# Patient Record
Sex: Male | Born: 1995 | Race: White | Hispanic: No | Marital: Single | Smoking: Current some day smoker
Health system: Southern US, Community
[De-identification: ages and names within clinical notes are randomized; demographics above are authoritative.]

## PROBLEM LIST (undated history)

## (undated) DIAGNOSIS — I10 Essential (primary) hypertension: Secondary | ICD-10-CM

## (undated) DIAGNOSIS — F909 Attention-deficit hyperactivity disorder, unspecified type: Secondary | ICD-10-CM

## (undated) DIAGNOSIS — F333 Major depressive disorder, recurrent, severe with psychotic symptoms: Secondary | ICD-10-CM

## (undated) DIAGNOSIS — F419 Anxiety disorder, unspecified: Secondary | ICD-10-CM

## (undated) HISTORY — DX: Anxiety disorder, unspecified: F41.9

## (undated) HISTORY — PX: OTHER SURGICAL HISTORY: SHX169

## (undated) HISTORY — DX: Major depressive disorder, recurrent, severe with psychotic symptoms: F33.3

## (undated) HISTORY — DX: Attention-deficit hyperactivity disorder, unspecified type: F90.9

---
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
## 2017-04-09 ENCOUNTER — Ambulatory Visit (INDEPENDENT_AMBULATORY_CARE_PROVIDER_SITE_OTHER): Payer: BC Managed Care – PPO | Admitting: Emergency Medicine

## 2017-04-09 ENCOUNTER — Encounter: Payer: Self-pay | Admitting: Emergency Medicine

## 2017-04-09 VITALS — BP 99/64 | HR 82 | Temp 99.0°F | Resp 17 | Ht 69.0 in | Wt 161.0 lb

## 2017-04-09 DIAGNOSIS — Z23 Encounter for immunization: Secondary | ICD-10-CM

## 2017-04-09 NOTE — Patient Instructions (Signed)
     IF you received an x-ray today, you will receive an invoice from [facility]. Please contact [facility] at [phone] with questions or concerns regarding your invoice.   IF you received labwork today, you will receive an invoice from [facility]. Please contact [facility] at [phone] with questions or concerns regarding your invoice.   Our billing staff will not be able to assist you with questions regarding bills from these companies.  You will be contacted with the lab results as soon as they are available. The fastest way to get your results is to activate your My Chart account. Instructions are located on the last page of this paperwork. If you have not heard from us regarding the results in 2 weeks, please contact this office.     

## 2017-04-10 NOTE — Progress Notes (Signed)
Here for immunizations required by college. Brought no prior records. No state records found. Advised to come back with needed vaccination records.

## 2017-04-11 ENCOUNTER — Ambulatory Visit (INDEPENDENT_AMBULATORY_CARE_PROVIDER_SITE_OTHER): Payer: BC Managed Care – PPO | Admitting: Emergency Medicine

## 2017-04-11 ENCOUNTER — Telehealth: Payer: Self-pay | Admitting: *Deleted

## 2017-04-11 ENCOUNTER — Encounter: Payer: Self-pay | Admitting: Emergency Medicine

## 2017-04-11 VITALS — BP 127/62 | HR 69 | Temp 98.2°F | Resp 16 | Ht 69.0 in | Wt 158.2 lb

## 2017-04-11 DIAGNOSIS — Z2839 Other underimmunization status: Secondary | ICD-10-CM

## 2017-04-11 DIAGNOSIS — Z9189 Other specified personal risk factors, not elsewhere classified: Secondary | ICD-10-CM

## 2017-04-11 NOTE — Telephone Encounter (Signed)
Patient forms are in Dr Latrelle DodrillSagardia's box at 102 to be completed when lab results are in.

## 2017-04-11 NOTE — Patient Instructions (Signed)
     IF you received an x-ray today, you will receive an invoice from [facility]. Please contact [facility] at [phone] with questions or concerns regarding your invoice.   IF you received labwork today, you will receive an invoice from [facility]. Please contact [facility] at [phone] with questions or concerns regarding your invoice.   Our billing staff will not be able to assist you with questions regarding bills from these companies.  You will be contacted with the lab results as soon as they are available. The fastest way to get your results is to activate your My Chart account. Instructions are located on the last page of this paperwork. If you have not heard from us regarding the results in 2 weeks, please contact this office.     

## 2017-04-11 NOTE — Progress Notes (Signed)
Timothy Patterson D Nickless 21 y.o.   Chief Complaint  Patient presents with  . Immunizations    per patient   . Depression    total score=8    HISTORY OF PRESENT ILLNESS: This is a 21 y.o. male here for vaccination advice; needed for college; has no records available. Form reviewed with patient and requirements discussed with patient.  HPI   Prior to Admission medications   Not on File    No Known Allergies  There are no active problems to display for this patient.   Past Medical History:  Diagnosis Date  . ADHD (attention deficit hyperactivity disorder)    HX of ADHD  . Anxiety     Past Surgical History:  Procedure Laterality Date  . ingrown toe nail      Social History   Social History  . Marital status: Single    Spouse name: N/A  . Number of children: N/A  . Years of education: N/A   Occupational History  . Not on file.   Social History Main Topics  . Smoking status: Never Smoker  . Smokeless tobacco: Never Used  . Alcohol use No  . Drug use: No  . Sexual activity: No   Other Topics Concern  . Not on file   Social History Narrative  . No narrative on file    Family History  Problem Relation Age of Onset  . Hyperlipidemia Paternal Grandfather   . Depression Mother   . Anxiety disorder Mother   . Bipolar disorder Mother   . Depression Sister   . Anxiety disorder Sister   . Bipolar disorder Maternal Grandmother   . Depression Maternal Grandmother      ROS Vitals:   04/11/17 1002  BP: 127/62  Pulse: 69  Resp: 16  Temp: 98.2 F (36.8 C)  SpO2: 98%     Physical Exam  Constitutional: He is oriented to person, place, and time. He appears well-developed and well-nourished.  HENT:  Head: Normocephalic and atraumatic.  Eyes: Pupils are equal, round, and reactive to light.  Neck: Normal range of motion.  Cardiovascular: Normal rate.   Pulmonary/Chest: Effort normal.  Musculoskeletal: Normal range of motion.  Neurological: He is  alert and oriented to person, place, and time.  Skin: Skin is warm and dry.  Vitals reviewed.    ASSESSMENT & PLAN: Timothy Patterson was seen today for immunizations and depression.  Diagnoses and all orders for this visit:  Incomplete immunization status -     Measles/Mumps/Rubella Immunity -     Varicella zoster antibody, IgG -     Hepatitis B surface antibody    Return next week to determine immune status. Vaccinations then according to results.  Edwina BarthMiguel Keil Pickering, MD [facility]

## 2017-04-12 LAB — MEASLES/MUMPS/RUBELLA IMMUNITY
MUMPS ABS, IGG: 271 [AU]/ml (ref >10.9)
RUBELLA: 3.62 {index} (ref >0.99)
RUBEOLA AB, IGG: 225 [AU]/ml (ref >29.9)

## 2017-04-12 LAB — VARICELLA ZOSTER ANTIBODY, IGG: Varicella zoster IgG: 747 index (ref >165)

## 2017-04-12 LAB — HEPATITIS B SURFACE ANTIBODY, QUANTITATIVE: Hepatitis B Surf Ab Quant: 3.4 m[IU]/mL — ABNORMAL LOW (ref >9.9)

## 2017-04-16 ENCOUNTER — Ambulatory Visit (INDEPENDENT_AMBULATORY_CARE_PROVIDER_SITE_OTHER): Payer: BC Managed Care – PPO | Admitting: Emergency Medicine

## 2017-04-16 ENCOUNTER — Encounter: Payer: Self-pay | Admitting: Emergency Medicine

## 2017-04-16 VITALS — BP 129/75 | HR 72 | Temp 98.7°F | Resp 18 | Ht 69.0 in | Wt 158.2 lb

## 2017-04-16 DIAGNOSIS — Z0184 Encounter for antibody response examination: Secondary | ICD-10-CM | POA: Diagnosis not present

## 2017-04-16 DIAGNOSIS — Z23 Encounter for immunization: Secondary | ICD-10-CM

## 2017-04-16 NOTE — Progress Notes (Signed)
Timothy Patterson [age] y.o.   Chief Complaint  Patient presents with  . Immunizations    getting results from labs     HISTORY OF PRESENT ILLNESS: This is a [age] y.o. male here to check blood results and get needed vaccines as required by college.  HPI   Prior to Admission medications   Not on File    No Known Allergies  Patient Active Problem List   Diagnosis Date Noted  . Immunity status testing 04/16/2017  . Need for meningitis vaccination 04/16/2017    Past Medical History:  Diagnosis Date  . ADHD (attention deficit hyperactivity disorder)    HX of ADHD  . Anxiety     Past Surgical History:  Procedure Laterality Date  . ingrown toe nail      Social History   Social History  . Marital status: Single    Spouse name: N/A  . Number of children: N/A  . Years of education: N/A   Occupational History  . Not on file.   Social History Main Topics  . Smoking status: Current Some Day Smoker    Types: Cigarettes  . Smokeless tobacco: Never Used  . Alcohol use No  . Drug use: No  . Sexual activity: No   Other Topics Concern  . Not on file   Social History Narrative  . No narrative on file    Family History  Problem Relation Age of Onset  . Hyperlipidemia Paternal Grandfather   . Depression Mother   . Anxiety disorder Mother   . Bipolar disorder Mother   . Depression Sister   . Anxiety disorder Sister   . Bipolar disorder Maternal Grandmother   . Depression Maternal Grandmother      ROS   Vitals:   04/16/17 1203  BP: 129/75  Pulse: 72  Resp: 18  Temp: 98.7 F (37.1 C)  SpO2: 98%     Physical Exam  Constitutional: He is oriented to person, place, and time. He appears well-developed and well-nourished.  HENT:  Head: Normocephalic.  Eyes: Pupils are equal, round, and reactive to light.  Cardiovascular: Normal rate.   Pulmonary/Chest: Effort normal.  Neurological: He is alert and oriented to person, place, and time.  Skin: Skin is  warm and dry.  Psychiatric: He has a normal mood and affect. His behavior is normal.  Vitals reviewed.    ASSESSMENT & PLAN: Timothy Patterson was seen today for immunizations.  Diagnoses and all orders for this visit:  Immunity status testing -     Poliovirus antibodies, types 1, 2, and 3  Need for prophylactic vaccination and inoculation against viral hepatitis -     Hepatitis B vaccine adult IM  Need for meningitis vaccination -     Meningococcal conjugate vaccine 4-valent IM  Other orders -     Cancel: Tdap vaccine greater than or equal to 7yo IM      Edwina BarthMiguel Gill Delrossi, MD [facility]

## 2017-04-16 NOTE — Patient Instructions (Signed)
     IF you received an x-ray today, you will receive an invoice from [facility]. Please contact [facility] at [phone] with questions or concerns regarding your invoice.   IF you received labwork today, you will receive an invoice from [facility]. Please contact [facility] at [phone] with questions or concerns regarding your invoice.   Our billing staff will not be able to assist you with questions regarding bills from these companies.  You will be contacted with the lab results as soon as they are available. The fastest way to get your results is to activate your My Chart account. Instructions are located on the last page of this paperwork. If you have not heard from us regarding the results in 2 weeks, please contact this office.     

## 2017-04-21 ENCOUNTER — Ambulatory Visit: Payer: BC Managed Care – PPO | Admitting: Emergency Medicine

## 2017-04-21 ENCOUNTER — Ambulatory Visit (INDEPENDENT_AMBULATORY_CARE_PROVIDER_SITE_OTHER): Payer: BC Managed Care – PPO | Admitting: Psychology

## 2017-04-21 DIAGNOSIS — F1010 Alcohol abuse, uncomplicated: Secondary | ICD-10-CM | POA: Diagnosis not present

## 2017-04-21 DIAGNOSIS — F1210 Cannabis abuse, uncomplicated: Secondary | ICD-10-CM

## 2017-04-21 LAB — POLIOVIRUS ANTIBODIES, TYPES 1, 2, AND 3
Poliovirus Ab Type 1: >=1:8 {titer}
Poliovirus Ab Type 3: >=1:8 {titer}

## 2017-04-22 ENCOUNTER — Encounter (HOSPITAL_COMMUNITY): Payer: Self-pay | Admitting: Psychiatry

## 2017-04-22 ENCOUNTER — Ambulatory Visit (INDEPENDENT_AMBULATORY_CARE_PROVIDER_SITE_OTHER): Payer: BC Managed Care – PPO | Admitting: Psychiatry

## 2017-04-22 ENCOUNTER — Encounter (HOSPITAL_COMMUNITY): Payer: Self-pay | Admitting: Psychology

## 2017-04-22 VITALS — BP 112/64 | HR 81 | Ht 69.0 in | Wt 162.0 lb

## 2017-04-22 DIAGNOSIS — F17210 Nicotine dependence, cigarettes, uncomplicated: Secondary | ICD-10-CM | POA: Diagnosis not present

## 2017-04-22 DIAGNOSIS — F22 Delusional disorders: Secondary | ICD-10-CM | POA: Diagnosis not present

## 2017-04-22 DIAGNOSIS — R440 Auditory hallucinations: Secondary | ICD-10-CM | POA: Diagnosis not present

## 2017-04-22 DIAGNOSIS — F29 Unspecified psychosis not due to a substance or known physiological condition: Secondary | ICD-10-CM

## 2017-04-22 DIAGNOSIS — F1220 Cannabis dependence, uncomplicated: Secondary | ICD-10-CM | POA: Diagnosis not present

## 2017-04-22 DIAGNOSIS — Z818 Family history of other mental and behavioral disorders: Secondary | ICD-10-CM | POA: Diagnosis not present

## 2017-04-22 DIAGNOSIS — F1010 Alcohol abuse, uncomplicated: Secondary | ICD-10-CM | POA: Insufficient documentation

## 2017-04-22 MED ORDER — QUETIAPINE FUMARATE 50 MG PO TABS: 50.0000 mg | ORAL_TABLET | Freq: Three times a day (TID) | ORAL | 1 refills | Status: DC

## 2017-04-22 NOTE — Progress Notes (Signed)
Psychiatric Initial Adult Assessment   Patient Identification: Timothy Patterson MRN:  [id number] Date of Evaluation:  04/22/2017 Referral Source: CD-IOP Chief Complaint:  hallucinations, anxiety Visit Diagnosis:    ICD-10-CM   1. Psychosis, unspecified psychosis type F29 QUEtiapine (SEROQUEL) 50 MG tablet  2. Cannabis use disorder, moderate, dependence (HCC) F12.20     History of Present Illness:  Timothy Patterson presents today for a psychiatric intake assessment, given concerns about his psychotic symptoms when he was assessed today by the CD IOP program. He presented with symptoms of paranoia, auditory hallucinations. Patient was cooperative and pleasant with Clinical research associate.  He presents as well-dressed, well-groomed, no disorganized speech or erratic behavior. He presents as quite nervous and is shaking his leg for much of our interaction, and appears restless and shifting in his chair quite frequently. He reports that he has struggled with depression and anxiety over the years, but things of gotten worse over the past 5 months since he experimented with mushrooms.  He reports that he started having auditory hallucinations at night, of a voice different than his own, calling him negative names, and hurting his feelings, especially related to issues of his self-esteem. He reports that the voices not one he recognizes, and it feels like it's coming from within his head but is different than his thoughts. He reports that he is able to make sense of the voice as being a trick from his brain, but it is still quite scary in the moment. He denies any visual hallucinations, but reports that sometimes he feels like he sees shadows. He reports that he does feel paranoid especially out public places, feels like others are judging him. He does not have any beliefs that other people can read his mind, but he does feel like he can sense the mood and body language of others and in a way can read other peoples  minds. He has also been worried that the FBI could monitor some of the websites that he goes to, but he was quite vague about what websites he might be using.  He reports that he realizes this all sounds "crazy" and he reports that he realizes that none of this is actually happening, and he maintains insight that his anxiety definitely makes these feelings worse. He reports that he has these out of body experiences when he is anxious and he's more panicked.  He denies any suicidal thoughts or homicidal thoughts. He does not have any specific paranoia towards any specific individuals. He denies any command auditory hallucinations.  I spent time with the patient asking him about his current substance abuse. He admits that he smokes marijuana on a "a lot" very frequent basis, and uses marijuana pretty much every night, and will use during the day when he has adequate access to a supply of marijuana. He reports that the marijuana helps him feel more relaxed so he can go to sleep and it also helps his appetite stay stable.  Spent time with the patient discussing the use of medications, in combination with reduction of his marijuana use.  Specifically, we discussed the use of Seroquel for sleep and for anxiety throughout the day. We discussed starting at a dose of 50-100 mg nightly, and 50 mg in the daytime as needed. Also discussed that if he wants to he can spread out the Seroquel to use 50 mg 3 times a day. I reviewed the risks and benefits of atypical antipsychotic, and the potential for weight gain with long-term  use. I suggested to the patient that he significantly cut down his marijuana use, engage in individual therapy, and follow-up with writer in 6 weeks.  Associated Signs/Symptoms: Depression Symptoms:  insomnia, fatigue, feelings of worthlessness/guilt, difficulty concentrating, anxiety, (Hypo) Manic Symptoms:  none Anxiety Symptoms:  Excessive Worry, Panic Symptoms, Social Anxiety, Psychotic  Symptoms:  Delusions, Hallucinations: Auditory Paranoia, PTSD Symptoms: Negative  Past Psychiatric History: No prior psychiatric hospitalizations  Previous Psychotropic Medications: No   Substance Abuse History in the last 12 months:  Yes.    Consequences of Substance Abuse: see symptoms above  Past Medical History:  Past Medical History:  Diagnosis Date  . ADHD (attention deficit hyperactivity disorder)    HX of ADHD  . Anxiety     Past Surgical History:  Procedure Laterality Date  . ingrown toe nail      Family Psychiatric History: as below  Family History:  Family History  Problem Relation Age of Onset  . Depression Mother   . Anxiety disorder Mother   . Bipolar disorder Mother   . Depression Sister   . Anxiety disorder Sister   . Hyperlipidemia Paternal Grandfather   . Bipolar disorder Maternal Grandmother   . Depression Maternal Grandmother     Social History:   Social History   Social History  . Marital status: Single    Spouse name: N/A  . Number of children: N/A  . Years of education: N/A   Social History Main Topics  . Smoking status: Current Some Day Smoker    Types: Cigarettes  . Smokeless tobacco: Never Used     Comment: not often  . Alcohol use 0.0 oz/week     Comment: occasional  . Drug use: No  . Sexual activity: No   Other Topics Concern  . None   Social History Narrative  . None    Additional Social History: in college at [employer]  Allergies:  No Known Allergies  Metabolic Disorder Labs: No results found for: HGBA1C, MPG No results found for: PROLACTIN No results found for: CHOL, TRIG, HDL, CHOLHDL, VLDL, LDLCALC   Current Medications: Current Outpatient Prescriptions  Medication Sig Dispense Refill  . QUEtiapine (SEROQUEL) 50 MG tablet Take 1 tablet (50 mg total) by mouth 3 (three) times daily. 90 tablet 1   No current facility-administered medications for this visit.     Neurologic: Headache:  Negative Seizure: Negative Paresthesias:Negative  Musculoskeletal: Strength & Muscle Tone: within normal limits Gait & Station: normal Patient leans: N/A  Psychiatric Specialty Exam: ROS  Blood pressure 112/64, pulse 81, height 5\' 9"  (1.753 m), weight 162 lb (73.5 kg).Body mass index is 23.92 kg/m.  General Appearance: Casual and Fairly Groomed  Eye Contact:  Fair  Speech:  Clear and Coherent  Volume:  Normal  Mood:  Anxious  Affect:  Appropriate and Congruent  Thought Process:  Coherent and Goal Directed  Orientation:  Full (Time, Place, and Person)  Thought Content:  Delusions and Hallucinations: Auditory  Suicidal Thoughts:  No  Homicidal Thoughts:  No  Memory:  Immediate;   Fair  Judgement:  Fair  Insight:  Shallow  Psychomotor Activity:  Normal  Concentration:  Attention Span: Fair  Recall:  [employer] of Knowledge:Fair  Language: Good  Akathisia:  Negative  Handed:  Right  AIMS (if indicated):  0  Assets:  Communication Skills Desire for Improvement Physical Health Transportation Vocational/Educational  ADL's:  Intact  Cognition: WNL  Sleep:  6-7 hours with THC  Treatment Plan Summary: Timothy Patterson is a 21 year old male with cannabis use disorder, and hallucinogenic abuse, episodic, in addition to unspecified social anxiety and panic. He presents today with symptoms of psychosis likely due to ongoing cannabis abuse. I spent time educating the patient about the dangers of cannabis especially given his presenting symptoms of psychosis. He would benefit from low-dose antipsychotic to more immediately address his anxiety and paranoid symptoms, and we will follow-up in 6 weeks. I cannot rule out early symptoms of thought disorder - his symptoms have been insidiously worsening over the past 4-6 months, and his sobriety from cannabis will be critical and being able to discern whether this is a primary mood/thought disorder or substance related. I recommended the  patient continue to engage in chemical dependence care and follow-up in 6 weeks.  1. Psychosis, unspecified psychosis type   2. Cannabis use disorder, moderate, dependence (HCC)    Follow-up with therapist in office for chemical dependence Initiate Seroquel 50 mg every 8 hours for anxiety, paranoia, panic, depression, hallucinations Recommend patient to discontinue his cannabis use Follow-up in 6 weeks  Burnard Leigh, MD 8/21/20183:01 PM

## 2017-04-22 NOTE — Patient Instructions (Signed)
Take seroquel 50 or 100 mg at bedtime  If you want to take an additional 50 or 100 mg in the daytime, for anxiety, that is okay  Come back in about 6 weeks
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
## 2017-04-23 ENCOUNTER — Encounter: Payer: Self-pay | Admitting: *Deleted

## 2017-04-23 NOTE — Progress Notes (Signed)
Letter sent.

## 2017-05-06 ENCOUNTER — Telehealth (HOSPITAL_COMMUNITY): Payer: Self-pay | Admitting: Licensed Clinical Social Worker

## 2017-05-06 NOTE — Telephone Encounter (Signed)
Called to inquire about pt starting CDIOP. No answer and not able to leave message due to "no voicemail set up".

## 2017-05-07 ENCOUNTER — Telehealth: Payer: Self-pay | Admitting: Physician Assistant

## 2017-05-07 NOTE — Telephone Encounter (Signed)
Needs copies of immunizations and lab results.

## 2017-06-04 ENCOUNTER — Emergency Department (HOSPITAL_COMMUNITY)
Admission: EM | Admit: 2017-06-04 | Discharge: 2017-06-05 | Disposition: A | Discharge status: Other Acute Inpatient Hospital | Payer: BC Managed Care – PPO | Attending: Emergency Medicine | Admitting: Emergency Medicine

## 2017-06-04 ENCOUNTER — Encounter (HOSPITAL_COMMUNITY): Payer: Self-pay | Admitting: Emergency Medicine

## 2017-06-04 DIAGNOSIS — F17200 Nicotine dependence, unspecified, uncomplicated: Secondary | ICD-10-CM | POA: Insufficient documentation

## 2017-06-04 DIAGNOSIS — Z79899 Other long term (current) drug therapy: Secondary | ICD-10-CM | POA: Diagnosis not present

## 2017-06-04 DIAGNOSIS — R440 Auditory hallucinations: Secondary | ICD-10-CM | POA: Diagnosis not present

## 2017-06-04 DIAGNOSIS — Z9114 Patient's other noncompliance with medication regimen: Secondary | ICD-10-CM | POA: Insufficient documentation

## 2017-06-04 DIAGNOSIS — Z046 Encounter for general psychiatric examination, requested by authority: Secondary | ICD-10-CM | POA: Diagnosis not present

## 2017-06-04 DIAGNOSIS — F29 Unspecified psychosis not due to a substance or known physiological condition: Secondary | ICD-10-CM | POA: Diagnosis present

## 2017-06-04 LAB — CBC WITH DIFFERENTIAL/PLATELET
BASOS ABS: 0 10*3/uL (ref 0.0–0.1)
BASOS PCT: 0 %
EOS ABS: 0.1 10*3/uL (ref 0.0–0.7)
Eosinophils Relative: 2 %
HEMATOCRIT: 46 % (ref 39.0–52.0)
HEMOGLOBIN: 16.3 g/dL (ref 13.0–17.0)
Lymphocytes Relative: 29 %
Lymphs Abs: 1.9 10*3/uL (ref 0.7–4.0)
MCH: 31.5 pg (ref 26.0–34.0)
MCHC: 35.4 g/dL (ref 30.0–36.0)
MCV: 89 fL (ref 78.0–100.0)
MONOS PCT: 8 %
Monocytes Absolute: 0.5 10*3/uL (ref 0.1–1.0)
NEUTROS ABS: 3.9 10*3/uL (ref 1.7–7.7)
NEUTROS PCT: 61 %
Platelets: 250 10*3/uL (ref 150–400)
RBC: 5.17 MIL/uL (ref 4.22–5.81)
RDW: 12.5 % (ref 11.5–15.5)
WBC: 6.4 10*3/uL (ref 4.0–10.5)

## 2017-06-04 LAB — RAPID URINE DRUG SCREEN, HOSP PERFORMED
AMPHETAMINES: NOT DETECTED
BARBITURATES: NOT DETECTED
Benzodiazepines: NOT DETECTED
Cocaine: NOT DETECTED
OPIATES: NOT DETECTED
TETRAHYDROCANNABINOL: POSITIVE — AB

## 2017-06-04 LAB — COMPREHENSIVE METABOLIC PANEL
ALBUMIN: 5 g/dL (ref 3.5–5.0)
ALK PHOS: 69 U/L (ref 38–126)
ALT: 16 U/L — AB (ref 17–63)
AST: 19 U/L (ref 15–41)
Anion gap: 11 (ref 5–15)
BUN: 15 mg/dL (ref 6–20)
CALCIUM: 10.1 mg/dL (ref 8.9–10.3)
CHLORIDE: 102 mmol/L (ref 101–111)
CO2: 26 mmol/L (ref 22–32)
CREATININE: 0.98 mg/dL (ref 0.61–1.24)
GFR calc non Af Amer: >60 mL/min (ref >60)
GLUCOSE: 89 mg/dL (ref 65–99)
Potassium: 4.2 mmol/L (ref 3.5–5.1)
SODIUM: 139 mmol/L (ref 135–145)
Total Bilirubin: 1.5 mg/dL — ABNORMAL HIGH (ref 0.3–1.2)
Total Protein: 9 g/dL — ABNORMAL HIGH (ref 6.5–8.1)

## 2017-06-04 LAB — ETHANOL: Alcohol, Ethyl (B): <5 mg/dL (ref <10)

## 2017-06-04 MED ORDER — ACETAMINOPHEN 325 MG PO TABS: 650.0000 mg | ORAL_TABLET | Freq: Four times a day (QID) | ORAL | Status: DC | PRN

## 2017-06-04 MED ORDER — QUETIAPINE FUMARATE 50 MG PO TABS
50.0000 mg | ORAL_TABLET | Freq: Three times a day (TID) | ORAL | Status: DC
  Administered 2017-06-04 – 2017-06-05 (×2): 50 mg via ORAL
  Filled 2017-06-04 (×2): qty 1

## 2017-06-04 NOTE — ED Provider Notes (Signed)
[facility]-EMERGENCY DEPT Provider Note   CSN: [id number] Arrival date & time: 06/04/17  1726     History   Chief Complaint Chief Complaint  Patient presents with  . auditory hallucinations    HPI Timothy Patterson is a 21 y.o. male.  The history is provided by the patient and a parent.  Mental Health Problem  Presenting symptoms: hallucinations   Patient accompanied by:  Parent Degree of incapacity (severity):  Moderate Onset quality:  Gradual Duration: several weeks. Timing:  Constant Progression:  Worsening Chronicity:  Recurrent Context: noncompliance   Relieved by:  Nothing Worsened by:  Nothing Associated symptoms: no anhedonia, no appetite change, no decreased need for sleep, no fatigue, no feelings of worthlessness and no headaches   Risk factors: hx of mental illness   Risk factors: no hx of suicide attempts     H/o schizo on seroquel. Last week hallucination worsened due to stress. Voice is are not malicious. Was intermittently noncompliant with meds.  Additionally patient reported "interacting with demon" 3 weeks ago. He states that the edema and try to get his psychiatric problems to come back.   Endorsing THC use recently.Denied any other drug use. No recent fevers or infections. No trauma.  Past Medical History:  Diagnosis Date  . ADHD (attention deficit hyperactivity disorder)    HX of ADHD  . Anxiety     Patient Active Problem List   Diagnosis Date Noted  . Alcohol use disorder, mild, abuse 04/22/2017  . Cannabis use disorder, moderate, dependence (HCC) 04/22/2017  . Immunity status testing 04/16/2017  . Need for meningitis vaccination 04/16/2017    Past Surgical History:  Procedure Laterality Date  . ingrown toe nail         Home Medications    Prior to Admission medications   Medication Sig Start Date End Date Taking? Authorizing Provider  QUEtiapine (SEROQUEL) 50 MG tablet Take 1 tablet (50 mg total) by mouth 3 (three) times  daily. Patient taking differently: Take 150 mg by mouth at bedtime.  04/22/17 04/22/18 Yes Eksir, Bo Mcclintock, MD    Family History Family History  Problem Relation Age of Onset  . Depression Mother   . Anxiety disorder Mother   . Bipolar disorder Mother   . Depression Sister   . Anxiety disorder Sister   . Hyperlipidemia Paternal Grandfather   . Bipolar disorder Maternal Grandmother   . Depression Maternal Grandmother     Social History Social History  Substance Use Topics  . Smoking status: Current Some Day Smoker    Types: Cigarettes  . Smokeless tobacco: Never Used     Comment: not often  . Alcohol use 0.0 oz/week     Comment: occasional     Allergies   Patient has no known allergies.   Review of Systems Review of Systems  Constitutional: Negative for appetite change and fatigue.  Neurological: Negative for headaches.  Psychiatric/Behavioral: Positive for hallucinations.   All other systems are reviewed and are negative for acute change except as noted in the HPI   Physical Exam Updated Vital Signs BP 130/82 (BP Location: Left Arm)   Pulse 83   Temp 98.5 F (36.9 C) (Oral)   Resp 20   SpO2 99%   Physical Exam  Constitutional: He is oriented to person, place, and time. He appears well-developed and well-nourished. No distress.  HENT:  Head: Normocephalic and atraumatic.  Nose: Nose normal.  Eyes: Pupils are equal, round, and reactive to light. Conjunctivae and  EOM are normal. Right eye exhibits no discharge. Left eye exhibits no discharge. No scleral icterus.  Neck: Normal range of motion. Neck supple.  Cardiovascular: Normal rate and regular rhythm.  Exam reveals no gallop and no friction rub.   No murmur heard. Pulmonary/Chest: Effort normal and breath sounds normal. No stridor. No respiratory distress. He has no rales.  Abdominal: Soft. He exhibits no distension. There is no tenderness.  Musculoskeletal: He exhibits no edema or tenderness.   Neurological: He is alert and oriented to person, place, and time.  Skin: Skin is warm and dry. No rash noted. He is not diaphoretic. No erythema.  Psychiatric: He has a normal mood and affect. His speech is normal and behavior is normal. Thought content normal. He is actively hallucinating.  Vitals reviewed.    ED Treatments / Results  Labs (all labs ordered are listed, but only abnormal results are displayed) Labs Reviewed  COMPREHENSIVE METABOLIC PANEL - Abnormal; Notable for the following:       Result Value   Total Protein 9.0 (*)    ALT 16 (*)    Total Bilirubin 1.5 (*)    All other components within normal limits  RAPID URINE DRUG SCREEN, HOSP PERFORMED - Abnormal; Notable for the following:    Tetrahydrocannabinol POSITIVE (*)    All other components within normal limits  ETHANOL  CBC WITH DIFFERENTIAL/PLATELET    EKG  EKG Interpretation None       Radiology No results found.  Procedures Procedures (including critical care time)  Medications Ordered in ED Medications  QUEtiapine (SEROQUEL) tablet 50 mg (not administered)  acetaminophen (TYLENOL) tablet 650 mg (not administered)     Initial Impression / Assessment and Plan / ED Course  I have reviewed the triage vital signs and the nursing notes.  Pertinent labs & imaging results that were available during my care of the patient were reviewed by me and considered in my medical decision making (see chart for details).     patient not compliant with his medication. Screening labs obtained.Medical cleared for behavioral health evaluation and management.  Final Clinical Impressions(s) / ED Diagnoses   Final diagnoses:  Auditory hallucinations      Cardama, Amadeo Garnet, MD 06/04/17 2232

## 2017-06-04 NOTE — ED Triage Notes (Signed)
Per pt, states he has been hearing voices and having psychotic symptoms-states thoughts try and attack his insecurities he has been taking Seroquel but thinks the dose is might be too low-denies SI/HI
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
## 2017-06-04 NOTE — BH Assessment (Signed)
[facility] Assessment Progress Note  Donell Sievert, PA recommends inpt treatment. EDP Cardama, Amadeo Garnet, MD notified and in agreement with recommendation. TTS spoke with [facility], RN who states [facility] is currently at capacity. TTS to seek placement.  Princess Bruins, MSW, LCSW Therapeutic Triage Specialist  [phone]

## 2017-06-04 NOTE — ED Notes (Addendum)
Pt presents with auditory hallucinations, denies visual hallucinations.  Denies SI or HI.  Pt feels his Seroquel meds are not working.  Pt admits to history of Depression and Anxiety.  A&O x 3, no distress noted, calm & cooperative, pt interactive.  Monitoring for safety, Q 15 min checks on effect.  Mother at bedside visiting at present.  Safety check for contraband completed, no items found.

## 2017-06-05 ENCOUNTER — Inpatient Hospital Stay (HOSPITAL_COMMUNITY)
Admission: EM | Admit: 2017-06-05 | Discharge: 2017-06-09 | DRG: 885 | Disposition: A | Discharge status: Home / Self Care | Payer: BC Managed Care – PPO | Source: Intra-hospital | Attending: Psychiatry | Admitting: Psychiatry

## 2017-06-05 ENCOUNTER — Telehealth (HOSPITAL_COMMUNITY): Payer: Self-pay | Admitting: Psychiatry

## 2017-06-05 ENCOUNTER — Encounter (HOSPITAL_COMMUNITY): Payer: Self-pay

## 2017-06-05 DIAGNOSIS — F410 Panic disorder [episodic paroxysmal anxiety] without agoraphobia: Secondary | ICD-10-CM | POA: Diagnosis present

## 2017-06-05 DIAGNOSIS — F4310 Post-traumatic stress disorder, unspecified: Secondary | ICD-10-CM | POA: Diagnosis not present

## 2017-06-05 DIAGNOSIS — F1210 Cannabis abuse, uncomplicated: Secondary | ICD-10-CM | POA: Diagnosis present

## 2017-06-05 DIAGNOSIS — F4010 Social phobia, unspecified: Secondary | ICD-10-CM | POA: Diagnosis present

## 2017-06-05 DIAGNOSIS — Z818 Family history of other mental and behavioral disorders: Secondary | ICD-10-CM | POA: Diagnosis not present

## 2017-06-05 DIAGNOSIS — R45851 Suicidal ideations: Secondary | ICD-10-CM | POA: Diagnosis not present

## 2017-06-05 DIAGNOSIS — F909 Attention-deficit hyperactivity disorder, unspecified type: Secondary | ICD-10-CM | POA: Diagnosis present

## 2017-06-05 DIAGNOSIS — F419 Anxiety disorder, unspecified: Secondary | ICD-10-CM | POA: Diagnosis not present

## 2017-06-05 DIAGNOSIS — R5383 Other fatigue: Secondary | ICD-10-CM | POA: Diagnosis not present

## 2017-06-05 DIAGNOSIS — F1010 Alcohol abuse, uncomplicated: Secondary | ICD-10-CM | POA: Diagnosis not present

## 2017-06-05 DIAGNOSIS — F29 Unspecified psychosis not due to a substance or known physiological condition: Secondary | ICD-10-CM | POA: Diagnosis present

## 2017-06-05 DIAGNOSIS — F39 Unspecified mood [affective] disorder: Secondary | ICD-10-CM | POA: Diagnosis not present

## 2017-06-05 DIAGNOSIS — R440 Auditory hallucinations: Secondary | ICD-10-CM

## 2017-06-05 DIAGNOSIS — F1910 Other psychoactive substance abuse, uncomplicated: Secondary | ICD-10-CM | POA: Diagnosis not present

## 2017-06-05 DIAGNOSIS — F329 Major depressive disorder, single episode, unspecified: Secondary | ICD-10-CM | POA: Diagnosis present

## 2017-06-05 DIAGNOSIS — F17210 Nicotine dependence, cigarettes, uncomplicated: Secondary | ICD-10-CM | POA: Diagnosis present

## 2017-06-05 DIAGNOSIS — R4584 Anhedonia: Secondary | ICD-10-CM | POA: Diagnosis not present

## 2017-06-05 DIAGNOSIS — Z915 Personal history of self-harm: Secondary | ICD-10-CM | POA: Diagnosis not present

## 2017-06-05 DIAGNOSIS — R450 Nervousness: Secondary | ICD-10-CM | POA: Diagnosis not present

## 2017-06-05 DIAGNOSIS — G4700 Insomnia, unspecified: Secondary | ICD-10-CM | POA: Diagnosis present

## 2017-06-05 DIAGNOSIS — F1220 Cannabis dependence, uncomplicated: Secondary | ICD-10-CM | POA: Diagnosis not present

## 2017-06-05 DIAGNOSIS — R451 Restlessness and agitation: Secondary | ICD-10-CM | POA: Diagnosis not present

## 2017-06-05 MED ORDER — LORAZEPAM 0.5 MG PO TABS: 0.5000 mg | ORAL_TABLET | Freq: Four times a day (QID) | ORAL | Status: DC | PRN

## 2017-06-05 MED ORDER — ARIPIPRAZOLE 5 MG PO TABS
5.0000 mg | ORAL_TABLET | Freq: Every day | ORAL | Status: DC
  Administered 2017-06-06: 5 mg via ORAL
  Filled 2017-06-05 (×3): qty 1

## 2017-06-05 MED ORDER — NICOTINE POLACRILEX 2 MG MT GUM
2.0000 mg | CHEWING_GUM | OROMUCOSAL | Status: DC | PRN
  Administered 2017-06-06 – 2017-06-07 (×2): 2 mg via ORAL

## 2017-06-05 MED ORDER — ALUM & MAG HYDROXIDE-SIMETH 200-200-20 MG/5ML PO SUSP: 30.0000 mL | ORAL | Status: DC | PRN

## 2017-06-05 MED ORDER — QUETIAPINE FUMARATE 50 MG PO TABS
50.0000 mg | ORAL_TABLET | Freq: Three times a day (TID) | ORAL | Status: DC
  Administered 2017-06-05: 50 mg via ORAL
  Filled 2017-06-05 (×6): qty 1

## 2017-06-05 MED ORDER — TRAZODONE HCL 50 MG PO TABS
50.0000 mg | ORAL_TABLET | Freq: Every evening | ORAL | Status: DC | PRN
  Administered 2017-06-05: 50 mg via ORAL
  Filled 2017-06-05 (×2): qty 1

## 2017-06-05 MED ORDER — MAGNESIUM HYDROXIDE 400 MG/5ML PO SUSP: 30.0000 mL | Freq: Every day | ORAL | Status: DC | PRN

## 2017-06-05 MED ORDER — ESCITALOPRAM OXALATE 5 MG PO TABS
5.0000 mg | ORAL_TABLET | Freq: Every day | ORAL | Status: DC
  Administered 2017-06-06 – 2017-06-09 (×4): 5 mg via ORAL
  Filled 2017-06-05 (×6): qty 1

## 2017-06-05 MED ORDER — ACETAMINOPHEN 325 MG PO TABS: 650.0000 mg | ORAL_TABLET | Freq: Four times a day (QID) | ORAL | Status: DC | PRN

## 2017-06-05 NOTE — Progress Notes (Signed)
Pt accepted to [facility] [id number] after 08:00 on 06/05/17 by Donell Sievert, PA. Attending will be Dr. Jama Flavors, MD. Call to report [phone]. Joanie Coddington, RN notified of acceptance.   Princess Bruins, MSW, LCSW Therapeutic Triage Specialist  [phone]
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
## 2017-06-05 NOTE — Telephone Encounter (Signed)
Thank you for the update!

## 2017-06-05 NOTE — BHH Suicide Risk Assessment (Signed)
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 Psychosis (HCC) [F29] 06/05/2017  . Alcohol use disorder, mild, abuse [F10.10] 04/22/2017  . Cannabis use disorder, moderate, dependence (HCC) [F12.20] 04/22/2017  . Immunity status testing [Z01.84] 04/16/2017  . Need for meningitis vaccination [Z23] 04/16/2017    Continued Clinical Symptoms:  Alcohol Use Disorder Identification Test Final Score (AUDIT): 2 The "Alcohol Use Disorders Identification Test", Guidelines for Use in Primary Care, Second Edition.  World Science writer [facility]). Score between 0-7:  no or low risk or alcohol related problems. Score between 8-15:  moderate risk of alcohol related problems. Score between 16-19:  high risk of alcohol related problems. Score 20 or above:  warrants further diagnostic evaluation for alcohol dependence and treatment.   CLINICAL FACTORS:  21 year old single male, college student, presents with depression and  psychotic symptoms, mainly hallucinations. Reports cannabis use disorder, but has cut down over recent weeks .    Psychiatric Specialty Exam: Physical Exam  ROS  Blood pressure 112/68, pulse 77, temperature 98.9 F (37.2 C), temperature source Oral, resp. rate 18, height  (1.753 m), weight 67.1 kg (148 lb), SpO2 98 %.Body mass index is 21.86 kg/m.  See admit note MSE                                                          COGNITIVE FEATURES THAT CONTRIBUTE TO RISK:  Closed-mindedness and Loss of executive function    SUICIDE RISK:   Moderate:  Frequent suicidal ideation with limited intensity, and duration, some specificity in terms of plans, no associated intent, good self-control, limited dysphoria/symptomatology, some risk factors present, and identifiable protective factors, including available and accessible social support.  PLAN OF CARE: Patient will be admitted to inpatient psychiatric unit for stabilization and safety. Will provide and encourage milieu participation. Provide medication management and maked adjustments as needed.  Will follow daily.    I certify that inpatient services furnished can reasonably be expected to improve the patient's condition.   Craige Cotta, MD 06/05/2017, 5:47 PM

## 2017-06-05 NOTE — Tx Team (Signed)
Initial Treatment Plan 06/05/2017 1:59 PM Timothy Patterson ZOX:[id number]    PATIENT STRESSORS: Educational concerns Medication change or noncompliance Substance abuse   PATIENT STRENGTHS: Ability for insight Communication skills Motivation for treatment/growth Supportive family/friends   PATIENT IDENTIFIED PROBLEMS: Auditory hallucinations  Derepression  Anxiety  " Learn how to trust people"  " Learn how to cope with voices"              DISCHARGE CRITERIA:  Improved stabilization in mood, thinking, and/or behavior Motivation to continue treatment in a less acute level of care Reduction of life-threatening or endangering symptoms to within safe limits Verbal commitment to aftercare and medication compliance  PRELIMINARY DISCHARGE PLAN: Attend aftercare/continuing care group Outpatient therapy Participate in family therapy Return to previous living arrangement  PATIENT/FAMILY INVOLVEMENT: This treatment plan has been presented to and reviewed with the patient, Timothy Patterson, and/or family member.  The patient and family have been given the opportunity to ask questions and make suggestions.  Bethann Punches, RN 06/05/2017, 1:59 PM

## 2017-06-05 NOTE — Progress Notes (Signed)
Timothy Patterson is a 21 y.o. male voluntary admitted for increased auditory hallucinations, depression and anxeity from from [facility]. Pt stated he had a psychotic breakdown, dropped out school. Pt stated he feels his life is melting down and has had time trusting people. His goal is to learn how to cope with the voices and learn how to trust people and social anxiety. Pt alert and oriented x 4, calm and cooperative with admission process. Consents signed, skin/belongings search completed and pt oriented to unit. Pt stable at this time. Pt given the opportunity to express concerns and ask questions. Pt given toiletries. Will continue to monitor.

## 2017-06-05 NOTE — ED Notes (Signed)
Pt reports auditory hallucination that have increased recently from one voice to a crowd of voices. Pt denies si and hi thoughts.

## 2017-06-05 NOTE — BH Assessment (Addendum)
BHH Assessment Progress Note  Per Donell Sievert, PA, this pt requires psychiatric hospitalization at this time.  Per EPIC notes, pt has been assigned pt to [facility] Rm [id number].  Pt has signed Voluntary Admission and Consent for Treatment, as well as Consent to Release Information to his parents and to Dr Gaspar Skeeters at [facility], and a notification call has been placed to the latter.  Signed forms have been faxed to [facility].  Pt's nurse, Jan, has been notified, and agrees to send original paperwork along with pt via Juel Burrow, and to call report to [phone].  Doylene Canning, MA Triage Specialist [phone]

## 2017-06-05 NOTE — ED Notes (Signed)
Pt d/c from hospital with [facility] to [facility]. All items returned to transport driver.

## 2017-06-05 NOTE — Plan of Care (Signed)
Problem: Coping: Goal: Ability to verbalize frustrations and anger appropriately will improve Outcome: Progressing Pt endorsed AH- non command  Problem: Safety: Goal: Periods of time without injury will increase Outcome: Progressing Pt safe on the unit at this time

## 2017-06-05 NOTE — Progress Notes (Signed)
D: Pt denies SI/HI/VH. Pt is pleasant and cooperative. Pt seen on the unit, pt endorsed AH- command .    A: Pt was offered support and encouragement. Pt was given scheduled medications. Pt was encourage to attend groups. Q 15 minute checks were done for safety.   R:Pt attends groups and interacts well with peers and staff. Pt is taking medication. Pt has no complaints.Pt receptive to treatment and safety maintained on unit.

## 2017-06-06 DIAGNOSIS — F39 Unspecified mood [affective] disorder: Secondary | ICD-10-CM

## 2017-06-06 DIAGNOSIS — F419 Anxiety disorder, unspecified: Secondary | ICD-10-CM

## 2017-06-06 LAB — LIPID PANEL
CHOLESTEROL: 99 mg/dL (ref 0–200)
HDL: 39 mg/dL — ABNORMAL LOW (ref >40)
LDL Cholesterol: 49 mg/dL (ref 0–99)
Total CHOL/HDL Ratio: 2.5 RATIO
Triglycerides: 56 mg/dL (ref <150)
VLDL: 11 mg/dL (ref 0–40)

## 2017-06-06 LAB — HEMOGLOBIN A1C
HEMOGLOBIN A1C: 4.5 % — AB (ref 4.8–5.6)
MEAN PLASMA GLUCOSE: 82.45 mg/dL

## 2017-06-06 MED ORDER — TRAZODONE HCL 100 MG PO TABS
100.0000 mg | ORAL_TABLET | Freq: Every evening | ORAL | Status: DC | PRN
  Administered 2017-06-06 – 2017-06-08 (×3): 100 mg via ORAL
  Filled 2017-06-06 (×3): qty 1

## 2017-06-06 MED ORDER — ARIPIPRAZOLE 10 MG PO TABS
10.0000 mg | ORAL_TABLET | Freq: Every day | ORAL | Status: DC
  Administered 2017-06-07 – 2017-06-09 (×3): 10 mg via ORAL
  Filled 2017-06-06 (×5): qty 1

## 2017-06-06 NOTE — Progress Notes (Signed)
Nursing Progress Note 1900-0730  D) Patient presents calm, pleasant and cooperative. Patient is seen interactive in the milieu. Patient denies SI/HI or pain but endorses auditory hallucinations. Patient contracts for safety on the unit. Patient reports sleeping well with current regimen. Patient compliant with medications.  A) Emotional support given. 1:1 interaction and active listening provided. Patient medicated as prescribed. Medications and plan of care reviewed with patient. Patient verbalized understanding without further questions. Snacks and fluids provided. Opportunities for questions or concerns presented to patient. Patient encouraged to continue to work on treatment goals. Labs, vital signs and patient behavior monitored throughout shift. Patient safety maintained with q15 min safety checks. Low fall risk precautions in place and reviewed with patient; patient verbalized understanding.  R) Patient receptive to interaction with nurse. Patient remains safe on the unit at this time. Patient denies any adverse medication reactions at this time. Patient is resting in bed without complaints. Will continue to monitor.

## 2017-06-06 NOTE — BHH Suicide Risk Assessment (Signed)
BHH INPATIENT:  Family/Significant Other Suicide Prevention Education  Suicide Prevention Education:  Education Completed; Timothy Patterson (mom [phone]), has been identified by the patient as the family member/significant other with whom the patient will be residing, and identified as the person(s) who will aid the patient in the event of a mental health crisis (suicidal ideations/suicide attempt).  With written consent from the patient, the family member/significant other has been provided the following suicide prevention education, prior to the and/or following the discharge of the patient.  The suicide prevention education provided includes the following:  Suicide risk factors  Suicide prevention and interventions  National Suicide Hotline telephone number  [facility] assessment telephone number  [facility] Emergency Assistance 911  [facility] and/or Residential Mobile Crisis Unit telephone number  Request made of family/significant other to:  Remove weapons (e.g., guns, rifles, knives), all items previously/currently identified as safety concern.    Remove drugs/medications (over-the-counter, prescriptions, illicit drugs), all items previously/currently identified as a safety concern.  The family member/significant other verbalizes understanding of the suicide prevention education information provided.  The family member/significant other agrees to remove the items of safety concern listed above.  Pt's mother states that there is a family history of depression of anxiety. Pt's mother states that she has been psychiatrically hospitalized herself in the past. Pt's mother has concerns that pt may be experiencing Schziophrenia. Pt's mother states that she does not want pt to be released from the hospital anytime soon and states that pt has the tendency to minimize his symptoms. Pt's mother states that pt does have a collection of knives. Pt's mother states that  she will remove the collection of knives from the home and make sure that pt does not have access to them. Pt's mother also states that pt smokes marijuana frequently.  Timothy Patterson, MSW, LCSWA  06/06/2017, 3:55 PM

## 2017-06-06 NOTE — Tx Team (Signed)
Interdisciplinary Treatment and Diagnostic Plan Update  06/06/2017 Time of Session: 9:30am Timothy Patterson MRN: [id number]  Principal Diagnosis: Psychosis [facility])  Secondary Diagnoses: Principal Problem:   Psychosis ([place])   Current Medications:  Current Facility-Administered Medications  Medication Dose Route Frequency Provider Last Rate Last Dose  . acetaminophen (TYLENOL) tablet 650 mg  650 mg Oral Q6H PRN Patrecia Pour, NP      . alum & mag hydroxide-simeth (MAALOX/MYLANTA) 200-200-20 MG/5ML suspension 30 mL  30 mL Oral Q4H PRN Patrecia Pour, NP      . Derrill Memo ON 06/07/2017] ARIPiprazole (ABILIFY) tablet 10 mg  10 mg Oral Daily Money, Darnelle Maffucci B, FNP      . escitalopram (LEXAPRO) tablet 5 mg  5 mg Oral Daily Cobos, Myer Peer, MD   5 mg at 06/06/17 0834  . LORazepam (ATIVAN) tablet 0.5 mg  0.5 mg Oral Q6H PRN Cobos, Myer Peer, MD      . magnesium hydroxide (MILK OF MAGNESIA) suspension 30 mL  30 mL Oral Daily PRN Patrecia Pour, NP      . nicotine polacrilex (NICORETTE) gum 2 mg  2 mg Oral PRN Cobos, Myer Peer, MD      . traZODone (DESYREL) tablet 100 mg  100 mg Oral QHS PRN Money, Lowry Ram, FNP        PTA Medications: Prescriptions Prior to Admission  Medication Sig Dispense Refill Last Dose  . QUEtiapine (SEROQUEL) 50 MG tablet Take 1 tablet (50 mg total) by mouth 3 (three) times daily. (Patient taking differently: Take 150 mg by mouth at bedtime. ) 90 tablet 1 06/03/2017 at Unknown time    Treatment Modalities: Medication Management, Group therapy, Case management,  1 to 1 session with clinician, Psychoeducation, Recreational therapy.  Patient Stressors: Educational concerns Medication change or noncompliance Substance abuse  Patient Strengths: Ability for Estate manager/land agent for treatment/growth Supportive family/friends  Physician Treatment Plan for Primary Diagnosis: Psychosis ([place]) Long Term Goal(s): Improvement in symptoms so as ready  for discharge  Short Term Goals: Ability to identify changes in lifestyle to reduce recurrence of condition will improve Ability to maintain clinical measurements within normal limits will improve Ability to identify triggers associated with substance abuse/mental health issues will improve  Medication Management: Evaluate patient's response, side effects, and tolerance of medication regimen.  Therapeutic Interventions: 1 to 1 sessions, Unit Group sessions and Medication administration.  Evaluation of Outcomes: Not Met  Physician Treatment Plan for Secondary Diagnosis: Principal Problem:   Psychosis ([place])   Long Term Goal(s): Improvement in symptoms so as ready for discharge  Short Term Goals: Ability to identify changes in lifestyle to reduce recurrence of condition will improve Ability to maintain clinical measurements within normal limits will improve Ability to identify triggers associated with substance abuse/mental health issues will improve  Medication Management: Evaluate patient's response, side effects, and tolerance of medication regimen.  Therapeutic Interventions: 1 to 1 sessions, Unit Group sessions and Medication administration.  Evaluation of Outcomes: Not Met   RN Treatment Plan for Primary Diagnosis: Psychosis ([place]) Long Term Goal(s): Knowledge of disease and therapeutic regimen to maintain health will improve  Short Term Goals: Ability to disclose and discuss suicidal ideas, Ability to identify and develop effective coping behaviors will improve and Compliance with prescribed medications will improve  Medication Management: RN will administer medications as ordered by provider, will assess and evaluate patient's response and provide education to patient for prescribed medication. RN will report any adverse and/or side  effects to prescribing provider.  Therapeutic Interventions: 1 on 1 counseling sessions, Psychoeducation, Medication administration, Evaluate  responses to treatment, Monitor vital signs and CBGs as ordered, Perform/monitor CIWA, COWS, AIMS and Fall Risk screenings as ordered, Perform wound care treatments as ordered.  Evaluation of Outcomes: Not Met   LCSW Treatment Plan for Primary Diagnosis: Psychosis ([place]) Long Term Goal(s): Safe transition to appropriate next level of care at discharge, Engage patient in therapeutic group addressing interpersonal concerns.  Short Term Goals: Engage patient in aftercare planning with referrals and resources and Increase skills for wellness and recovery  Therapeutic Interventions: Assess for all discharge needs, 1 to 1 time with Social worker, Explore available resources and support systems, Assess for adequacy in community support network, Educate family and significant other(s) on suicide prevention, Complete Psychosocial Assessment, Interpersonal group therapy.  Evaluation of Outcomes: Not Met   Progress in Treatment: Attending groups: Pt is new to milieu, continuing to assess  Participating in groups: Pt is new to milieu, continuing to assess  Taking medication as prescribed: Yes, MD continues to assess for medication changes as needed Toleration medication: Yes, no side effects reported at this time Family/Significant other contact made: Yes with mother Patient understands diagnosis: Continuing to assess Discussing patient identified problems/goals with staff: Yes Medical problems stabilized or resolved: Yes Denies suicidal/homicidal ideation: Yes Issues/concerns per patient self-inventory: None Other: N/A  New problem(s) identified: None identified at this time.   New Short Term/Long Term Goal(s): None identified at this time.   Discharge Plan or Barriers: Pt will return home and follow-up with outpatient services.   Reason for Continuation of Hospitalization: Anxiety Depression Hallucinations/Bizarre thoughts Medication stabilization  Estimated Length of Stay: 3-5 days; est  DC date 10/10  Attendees: Patient:  06/06/2017  4:23 PM  Physician: Dr. Parke Poisson, MD 06/06/2017  4:23 PM  Nursing: Opal Sidles RN 06/06/2017  4:23 PM  RN Care Manager: Lars Pinks, RN 06/06/2017  4:23 PM  Social Worker: Adriana Reams, LCSW 06/06/2017  4:23 PM  Recreational Therapist:  06/06/2017  4:23 PM  Other: Lindell Spar, NP; Marvia Pickles, NP 06/06/2017  4:23 PM  Other:  06/06/2017  4:23 PM  Other: 06/06/2017  4:23 PM    Scribe for Treatment Team: Gladstone Lighter, LCSW 06/06/2017 4:23 PM
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
## 2017-06-06 NOTE — Progress Notes (Signed)
Recreation Therapy Notes  Date: 06/06/17 Time: 0930 Location: [address] Dayroom  Group Topic: Stress Management  Goal Area(s) Addresses:  Patient will verbalize importance of using healthy stress management.  Patient will identify positive emotions associated with healthy stress management.   Intervention: Stress Management  Activity :  Progressive Muscle Relaxation.  LRT introduced the stress management technique of progressive muscle relaxation.  LRT read a script to guide patients through tensing and relaxing each muscle group.  Patients were to follow along as script was read to engage in the activity.  Education:  Stress Management, Discharge Planning.   Education Outcome: Acknowledges edcuation/In group clarification offered/Needs additional education  Clinical Observations/Feedback: Pt did not attend group.   Caroll Rancher, LRT/CTRS         Lillia Abed, Kjersti Dittmer A 06/06/2017 11:29 AM

## 2017-06-06 NOTE — Progress Notes (Signed)
[facility] MD Progress Note  06/06/2017 3:49 PM Timothy Patterson  MRN:  [id number]   Subjective:  Patient reports that he is still having some anxiety and that he still feels paranoid. He denies any SI/HI/AVH. He states that he slept well but it took him hours to get to sleep and he just laid there.   Objective: patient's chart and findings reviewed and discussed with treatment team. Patient is cooperative. He has been seen in the day room interacting appropriately.  Will increase Trazodone to 100 mg  And Abilify to 10 mg.  Principal Problem: Psychosis (HCC) Diagnosis:   Patient Active Problem List   Diagnosis Date Noted  . Psychosis (HCC) [F29] 06/05/2017  . Alcohol use disorder, mild, abuse [F10.10] 04/22/2017  . Cannabis use disorder, moderate, dependence (HCC) [F12.20] 04/22/2017  . Immunity status testing [Z01.84] 04/16/2017  . Need for meningitis vaccination [Z23] 04/16/2017   Total Time spent with patient: 25 minutes  Past Psychiatric History: See H&P  Past Medical History:  Past Medical History:  Diagnosis Date  . ADHD (attention deficit hyperactivity disorder)    HX of ADHD  . Anxiety     Past Surgical History:  Procedure Laterality Date  . ingrown toe nail     Family History:  Family History  Problem Relation Age of Onset  . Depression Mother   . Anxiety disorder Mother   . Bipolar disorder Mother   . Depression Sister   . Anxiety disorder Sister   . Hyperlipidemia Paternal Grandfather   . Bipolar disorder Maternal Grandmother   . Depression Maternal Grandmother    Family Psychiatric  History: See H&P Social History:  History  Alcohol Use  . 0.0 oz/week    Comment: occasional     History  Drug Use No    Social History   Social History  . Marital status: Single    Spouse name: N/A  . Number of children: N/A  . Years of education: N/A   Social History Main Topics  . Smoking status: Current Some Day Smoker    Types: Cigarettes  . Smokeless  tobacco: Never Used     Comment: not often  . Alcohol use 0.0 oz/week     Comment: occasional  . Drug use: No  . Sexual activity: No   Other Topics Concern  . None   Social History Narrative  . None   Additional Social History:                         Sleep: Fair  Appetite:  Good  Current Medications: Current Facility-Administered Medications  Medication Dose Route Frequency Provider Last Rate Last Dose  . acetaminophen (TYLENOL) tablet 650 mg  650 mg Oral Q6H PRN Charm Rings, NP      . alum & mag hydroxide-simeth (MAALOX/MYLANTA) 200-200-20 MG/5ML suspension 30 mL  30 mL Oral Q4H PRN Charm Rings, NP      . Melene Muller ON 06/07/2017] ARIPiprazole (ABILIFY) tablet 10 mg  10 mg Oral Daily Money, Feliz Beam B, FNP      . escitalopram (LEXAPRO) tablet 5 mg  5 mg Oral Daily Cobos, Rockey Situ, MD   5 mg at 06/06/17 0834  . LORazepam (ATIVAN) tablet 0.5 mg  0.5 mg Oral Q6H PRN Cobos, Fernando A, MD      . magnesium hydroxide (MILK OF MAGNESIA) suspension 30 mL  30 mL Oral Daily PRN Charm Rings, NP      .  nicotine polacrilex (NICORETTE) gum 2 mg  2 mg Oral PRN Cobos, Rockey Situ, MD      . traZODone (DESYREL) tablet 50 mg  50 mg Oral QHS PRN Cobos, Rockey Situ, MD   50 mg at 06/05/17 2148    Lab Results:  Results for orders placed or performed during the hospital encounter of 06/05/17 (from the past 48 hour(s))  Hemoglobin A1c     Status: Abnormal   Collection Time: 06/06/17  6:27 AM  Result Value Ref Range   Hgb A1c MFr Bld 4.5 (L) 4.8 - 5.6 %    Comment: (NOTE) Pre diabetes:          5.7%-6.4% Diabetes:              >6.4% Glycemic control for   <7.0% adults with diabetes    Mean Plasma Glucose 82.45 mg/dL    Comment: Performed at [facility], [address] [place], [place] [address]  Lipid panel     Status: Abnormal   Collection Time: 06/06/17  6:27 AM  Result Value Ref Range   Cholesterol 99 0 - 200 mg/dL   Triglycerides 56 <295 mg/dL   HDL 39 (L) >62  mg/dL   Total CHOL/HDL Ratio 2.5 RATIO   VLDL 11 0 - 40 mg/dL   LDL Cholesterol 49 0 - 99 mg/dL    Comment:        Total Cholesterol/HDL:CHD Risk Coronary Heart Disease Risk Table                     Men   Women  1/2 Average Risk   3.4   3.3  Average Risk       5.0   4.4  2 X Average Risk   9.6   7.1  3 X Average Risk  23.4   11.0        Use the calculated Patient Ratio above and the CHD Risk Table to determine the patient's CHD Risk.        ATP III CLASSIFICATION (LDL):  <100     mg/dL   Optimal  130-865  mg/dL   Near or Above                    Optimal  130-159  mg/dL   Borderline  784-696  mg/dL   High  >295     mg/dL   Very High Performed at [facility], [address], [place], [place] [address]     Blood Alcohol level:  Lab Results  Component Value Date   [facility] <5 06/04/2017    Metabolic Disorder Labs: Lab Results  Component Value Date   HGBA1C 4.5 (L) 06/06/2017   MPG 82.45 06/06/2017   No results found for: PROLACTIN Lab Results  Component Value Date   CHOL 99 06/06/2017   TRIG 56 06/06/2017   HDL 39 (L) 06/06/2017   CHOLHDL 2.5 06/06/2017   VLDL 11 06/06/2017   LDLCALC 49 06/06/2017    Physical Findings: AIMS: Facial and Oral Movements Muscles of Facial Expression: None, normal Lips and Perioral Area: None, normal Jaw: None, normal Tongue: None, normal,Extremity Movements Upper (arms, wrists, hands, fingers): None, normal Lower (legs, knees, ankles, toes): None, normal, Trunk Movements Neck, shoulders, hips: None, normal, Overall Severity Severity of abnormal movements (highest score from questions above): None, normal Incapacitation due to abnormal movements: None, normal Patient's awareness of abnormal movements (rate only patient's report): No Awareness,  Dental Status Current problems with teeth and/or dentures?: No Does patient usually wear dentures?: No  CIWA:    COWS:     Musculoskeletal: Strength & Muscle Tone: within normal  limits Gait & Station: normal Patient leans: N/A  Psychiatric Specialty Exam: Physical Exam  Nursing note and vitals reviewed. Constitutional: He is oriented to person, place, and time. He appears well-developed and well-nourished.  Cardiovascular: Normal rate.   Respiratory: Effort normal.  Musculoskeletal: Normal range of motion.  Neurological: He is alert and oriented to person, place, and time.  Skin: Skin is warm.    Review of Systems  Constitutional: Negative.   HENT: Negative.   Eyes: Negative.   Respiratory: Negative.   Cardiovascular: Negative.   Gastrointestinal: Negative.   Genitourinary: Negative.   Musculoskeletal: Negative.   Skin: Negative.   Neurological: Negative.   Endo/Heme/Allergies: Negative.     Blood pressure 112/86, pulse 86, temperature 98.4 F (36.9 C), temperature source Oral, resp. rate 18, height  (1.753 m), weight 67.1 kg (148 lb), SpO2 98 %.Body mass index is 21.86 kg/m.  General Appearance: Casual  Eye Contact:  Good  Speech:  Clear and Coherent and Normal Rate  Volume:  Decreased  Mood:  Anxious  Affect:  Flat  Thought Process:  Goal Directed and Descriptions of Associations: Intact  Orientation:  Full (Time, Place, and Person)  Thought Content:  Paranoid Ideation  Suicidal Thoughts:  No  Homicidal Thoughts:  No  Memory:  Immediate;   Good Recent;   Good Remote;   Good  Judgement:  Fair  Insight:  Fair  Psychomotor Activity:  Normal  Concentration:  Concentration: Good  Recall:  Good  Fund of Knowledge:  Good  Language:  Good  Akathisia:  No  Handed:  Right  AIMS (if indicated):     Assets:  Desire for Improvement Financial Resources/Insurance Housing Social Support Transportation  ADL's:  Intact  Cognition:  WNL  Sleep:  Number of Hours: 6.75     Treatment Plan Summary: Daily contact with patient to assess and evaluate symptoms and progress in treatment, Medication management and Plan is to:  -Increase Abilify  10 mg PO Daily for mood stability -Continue Lexapro 5 mg PO Daily for mood stability -Continue Ativan 0.5 mg PO Q6H PRN for anxiety -Increase Trazodone 100 mg PO QHS for insomnia -Encourage group therapy participation  Maryfrances Bunnell, FNP 06/06/2017, 3:49 PM   Agree with NP Progress Note

## 2017-06-06 NOTE — Plan of Care (Signed)
Problem: Safety: Goal: Periods of time without injury will increase Outcome: Progressing Patient is on q15 minute safety checks and low fall risk precautions. Patient contracts for safety on the unit and remains safe at this time.   

## 2017-06-06 NOTE — Progress Notes (Signed)
DAR NOTE: Patient presents with calm affect and pleasant mood.  Denies pain, auditory and visual hallucinations. Pt stated that the medications he is taking are making him abit tired. Reports fair sleep, fair appetite, normal energy and good concentration. Rates depression at 6, hopelessness at 0, and anxiety at 0.  Maintained on routine safety checks.  Medications given as prescribed.  Support and encouragement offered as needed.  States goal for today is " talk to poeple."  Patient observed socializing with peers in the dayroom.  Offered no complaint.

## 2017-06-06 NOTE — Progress Notes (Signed)
Adult Psychoeducational Group Note  Date:  06/06/2017 Time:  11:31 PM  Group Topic/Focus:  Wrap-Up Group:   The focus of this group is to help patients review their daily goal of treatment and discuss progress on daily workbooks.  Participation Level:  Active  Participation Quality:  Appropriate  Affect:  Appropriate  Cognitive:  Appropriate  Insight: Good  Engagement in Group:  Engaged  Modes of Intervention:  Discussion  Additional Comments: Pt stated that his goal was met by talking with people and playing chess was something positive that happen for him today  Leondra Cullin A 06/06/2017, 11:31 PM

## 2017-06-06 NOTE — [facility] Group Notes (Signed)
LCSW Group Therapy Note  06/06/2017 1:15pm  Type of Therapy and Topic:  Group Therapy:  Feelings around Relapse and Recovery  Participation Level:  Active   Description of Group:    Patients in this group will discuss emotions they experience before and after a relapse. They will process how experiencing these feelings, or avoidance of experiencing them, relates to having a relapse. Facilitator will guide patients to explore emotions they have related to recovery. Patients will be encouraged to process which emotions are more powerful. They will be guided to discuss the emotional reaction significant others in their lives may have to their relapse or recovery. Patients will be assisted in exploring ways to respond to the emotions of others without this contributing to a relapse.  Therapeutic Goals: 1. Patient will identify two or more emotions that lead to a relapse for them 2. Patient will identify two emotions that result when they relapse 3. Patient will identify two emotions related to recovery 4. Patient will demonstrate ability to communicate their needs through discussion and/or role plays   Summary of Patient Progress: Pt was able to identify warning signs and triggers related to his depression. Pt discussed self-medicating with nicotine and hanging around people who encourage him to make poor decisions.    Therapeutic Modalities:   Cognitive Behavioral Therapy Solution-Focused Therapy Assertiveness Training Relapse Prevention Therapy   Verdene Lennert, LCSW 06/06/2017 4:21 PM

## 2017-06-07 DIAGNOSIS — R450 Nervousness: Secondary | ICD-10-CM

## 2017-06-07 LAB — PROLACTIN: PROLACTIN: 20.6 ng/mL — AB (ref 4.0–15.2)

## 2017-06-07 NOTE — [facility] Group Notes (Addendum)
[facility] LCSW Group Therapy Note  06/07/2017 @ 10:15 to 11:15 AM  Type of Therapy and Topic:  Group Therapy: Avoiding Self-Sabotaging and Enabling Behaviors  Participation Level:  Active   Description of Group The main focus of today's process group to discuss what "self-sabotage" means and use motivational iInterviewing to discuss what benefits, negative or positive, were involved in a self-identified self-sabotaging behavior. We then talked about reasons the patient may want to change the behavior and their current desire to change.   Summary of Patient Progress: Patient shared easily when asked direct questions and otherwise was attentive to group process. Patient identified most with negative self talk and isolation as self sabotaging behaviors he practices. He feels others may view him as uncaring as he goes to great lengths to protect himself and often will keep his distance in order to protect himself.     Therapeutic molalities: Cognitive Behavioral Therapy Person-Centered Therapy Motivational Interviewing  Therapeutic Goals: 1. Patients will demonstrate understanding of the concept of self sabotage 2. Patients will be able to identify pros and cons of their behaviors 3. Patients will be able to identify at least two motivating factors for l of their desire for change   Carney Bern, LCSW

## 2017-06-07 NOTE — Plan of Care (Signed)
Problem: Health Behavior/Discharge Planning: Goal: Compliance with treatment plan for underlying cause of condition will improve Outcome: Progressing Patient attended group this evening and is taking medications.

## 2017-06-07 NOTE — Progress Notes (Signed)
[facility] MD Progress Note  06/07/2017 12:44 PM Timothy Patterson  MRN:  [id number]   Subjective:  Timothy Patterson reports " I did have a mini psychosis meltdown, however I am feeling a lot better today."   Objective: Timothy Patterson is awake, alert and oriented. Patient reports his depression 4/10 today and reports he is still experiencing auditory hallucinations, however reports the voices has improved since admission. Patient reports he is medication compliant with the Abilify and Lexapro   without mediation side effects.  Reports good appetite and states he is resting well. Support, encouragement and reassurance was provided.   Principal Problem: Psychosis (HCC) Diagnosis:   Patient Active Problem List   Diagnosis Date Noted  . Psychosis (HCC) [F29] 06/05/2017  . Alcohol use disorder, mild, abuse [F10.10] 04/22/2017  . Cannabis use disorder, moderate, dependence (HCC) [F12.20] 04/22/2017  . Immunity status testing [Z01.84] 04/16/2017  . Need for meningitis vaccination [Z23] 04/16/2017   Total Time spent with patient: 25 minutes  Past Psychiatric History: See H&P  Past Medical History:  Past Medical History:  Diagnosis Date  . ADHD (attention deficit hyperactivity disorder)    HX of ADHD  . Anxiety     Past Surgical History:  Procedure Laterality Date  . ingrown toe nail     Family History:  Family History  Problem Relation Age of Onset  . Depression Mother   . Anxiety disorder Mother   . Bipolar disorder Mother   . Depression Sister   . Anxiety disorder Sister   . Hyperlipidemia Paternal Grandfather   . Bipolar disorder Maternal Grandmother   . Depression Maternal Grandmother    Family Psychiatric  History: See H&P Social History:  History  Alcohol Use  . 0.0 oz/week    Comment: occasional     History  Drug Use No    Social History   Social History  . Marital status: Single    Spouse name: N/A  . Number of children: N/A  . Years of education: N/A    Social History Main Topics  . Smoking status: Current Some Day Smoker    Types: Cigarettes  . Smokeless tobacco: Never Used     Comment: not often  . Alcohol use 0.0 oz/week     Comment: occasional  . Drug use: No  . Sexual activity: No   Other Topics Concern  . None   Social History Narrative  . None   Additional Social History:                         Sleep: Fair  Appetite:  Good  Current Medications: Current Facility-Administered Medications  Medication Dose Route Frequency Provider Last Rate Last Dose  . acetaminophen (TYLENOL) tablet 650 mg  650 mg Oral Q6H PRN Charm Rings, NP      . alum & mag hydroxide-simeth (MAALOX/MYLANTA) 200-200-20 MG/5ML suspension 30 mL  30 mL Oral Q4H PRN Charm Rings, NP      . ARIPiprazole (ABILIFY) tablet 10 mg  10 mg Oral Daily Money, Gerlene Burdock, FNP   10 mg at 06/07/17 0758  . escitalopram (LEXAPRO) tablet 5 mg  5 mg Oral Daily Cobos, Rockey Situ, MD   5 mg at 06/07/17 0756  . LORazepam (ATIVAN) tablet 0.5 mg  0.5 mg Oral Q6H PRN Cobos, Fernando A, MD      . magnesium hydroxide (MILK OF MAGNESIA) suspension 30 mL  30 mL Oral Daily PRN Nanine Means  Y, NP      . nicotine polacrilex (NICORETTE) gum 2 mg  2 mg Oral PRN Cobos, Rockey Situ, MD   2 mg at 06/07/17 0756  . traZODone (DESYREL) tablet 100 mg  100 mg Oral QHS PRN Money, Gerlene Burdock, FNP   100 mg at 06/06/17 2201    Lab Results:  Results for orders placed or performed during the hospital encounter of 06/05/17 (from the past 48 hour(s))  Hemoglobin A1c     Status: Abnormal   Collection Time: 06/06/17  6:27 AM  Result Value Ref Range   Hgb A1c MFr Bld 4.5 (L) 4.8 - 5.6 %    Comment: (NOTE) Pre diabetes:          5.7%-6.4% Diabetes:              >6.4% Glycemic control for   <7.0% adults with diabetes    Mean Plasma Glucose 82.45 mg/dL    Comment: Performed at [facility], [address] [place], [place] [address]  Prolactin     Status: Abnormal    Collection Time: 06/06/17  6:27 AM  Result Value Ref Range   Prolactin 20.6 (H) 4.0 - 15.2 ng/mL    Comment: (NOTE) Performed At: [facility] [address] [place], [place] [id number] Mila Homer MD [id number] Performed at [facility], [address], [place], [place] [address]   Lipid panel     Status: Abnormal   Collection Time: 06/06/17  6:27 AM  Result Value Ref Range   Cholesterol 99 0 - 200 mg/dL   Triglycerides 56 <784 mg/dL   HDL 39 (L) >69 mg/dL   Total CHOL/HDL Ratio 2.5 RATIO   VLDL 11 0 - 40 mg/dL   LDL Cholesterol 49 0 - 99 mg/dL    Comment:        Total Cholesterol/HDL:CHD Risk Coronary Heart Disease Risk Table                     Men   Women  1/2 Average Risk   3.4   3.3  Average Risk       5.0   4.4  2 X Average Risk   9.6   7.1  3 X Average Risk  23.4   11.0        Use the calculated Patient Ratio above and the CHD Risk Table to determine the patient's CHD Risk.        ATP III CLASSIFICATION (LDL):  <100     mg/dL   Optimal  629-528  mg/dL   Near or Above                    Optimal  130-159  mg/dL   Borderline  413-244  mg/dL   High  >010     mg/dL   Very High Performed at [facility], [address] [place], [place] [address]     Blood Alcohol level:  Lab Results  Component Value Date   [facility] <5 06/04/2017    Metabolic Disorder Labs: Lab Results  Component Value Date   HGBA1C 4.5 (L) 06/06/2017   MPG 82.45 06/06/2017   Lab Results  Component Value Date   PROLACTIN 20.6 (H) 06/06/2017   Lab Results  Component Value Date   CHOL 99 06/06/2017   TRIG 56 06/06/2017   HDL 39 (L) 06/06/2017   CHOLHDL 2.5 06/06/2017   VLDL 11 06/06/2017  LDLCALC 49 06/06/2017    Physical Findings: AIMS: Facial and Oral Movements Muscles of Facial Expression: None, normal Lips and Perioral Area: None, normal Jaw: None, normal Tongue: None, normal,Extremity Movements Upper (arms, wrists, hands, fingers):  None, normal Lower (legs, knees, ankles, toes): None, normal, Trunk Movements Neck, shoulders, hips: None, normal, Overall Severity Severity of abnormal movements (highest score from questions above): None, normal Incapacitation due to abnormal movements: None, normal Patient's awareness of abnormal movements (rate only patient's report): No Awareness, Dental Status Current problems with teeth and/or dentures?: No Does patient usually wear dentures?: No  CIWA:    COWS:     Musculoskeletal: Strength & Muscle Tone: within normal limits Gait & Station: normal Patient leans: N/A  Psychiatric Specialty Exam: Physical Exam  Nursing note and vitals reviewed. Constitutional: He is oriented to person, place, and time. He appears well-developed and well-nourished.  Cardiovascular: Normal rate.   Respiratory: Effort normal.  Musculoskeletal: Normal range of motion.  Neurological: He is alert and oriented to person, place, and time.  Skin: Skin is warm.    Review of Systems  Musculoskeletal: Myalgias:    Psychiatric/Behavioral: Positive for depression and hallucinations. The patient is nervous/anxious.     Blood pressure 104/77, pulse 95, temperature 98 F (36.7 C), resp. rate 20, height  (1.753 m), weight 67.1 kg (148 lb), SpO2 98 %.Body mass index is 21.86 kg/m.  General Appearance: Casual  Eye Contact:  Good  Speech:  Clear and Coherent  Volume:  Decreased  Mood:  Anxious  Affect:  Flat  Thought Process:  Goal Directed  Orientation:  Full (Time, Place, and Person)  Thought Content:  Hallucinations: Auditory  Suicidal Thoughts:  No  Homicidal Thoughts:  No  Memory:  Immediate;   Good Recent;   Good Remote;   Good  Judgement:  Fair  Insight:  Fair  Psychomotor Activity:  Normal  Concentration:  Concentration: Good  Recall:  Good  Fund of Knowledge:  Good  Language:  Good  Akathisia:  No  Handed:  Right  AIMS (if indicated):     Assets:  Desire for  Improvement Financial Resources/Insurance Housing Social Support Transportation  ADL's:  Intact  Cognition:  WNL  Sleep:  Number of Hours: 6.75     I agree with current treatment plan on 06/07/2017, Patient seen face-to-face for psychiatric evaluation follow-up, chart reviewed.Reviewed the information documented and agree with the treatment plan.  Treatment Plan Summary: Daily contact with patient to assess and evaluate symptoms and progress in treatment, Medication management and Plan is to:   Continue with current treatment plan listed below on 06/07/2017 except where noted  -Continue  Abilify 10 mg PO Daily for mood stabilization -Continue Lexapro 5 mg PO Daily for mood stabilization -Continue Ativan 0.5 mg PO Q6H PRN for anxiety -Continue Trazodone 100 mg PO QHS for insomnia -Encourage group therapy participation  Oneta Rack, NP 06/07/2017, 12:44 PM

## 2017-06-07 NOTE — Progress Notes (Signed)
Patient ID: Timothy Patterson, male   DOB: [birth year], 21 y.o.   MRN: [id number]    D: Pt has been very withdrawn today on the unit, he did not interact with peers or staff. Pt remained to himself and just read his book. Pt reported that he was feeling better today. Pt did attend all groups and engaged in treatment. Pt reported that his depression was a 7, his hopelessness was a 0, and his anxiety was a 6. Pt reported that his goal for today was to avoid social anxiety and take his medication. Pt reported being negative SI/HI, no AH/VH noted. A: 15 min checks continued for patient safety. R: Pt safety maintained.

## 2017-06-07 NOTE — Progress Notes (Signed)
Nursing Progress Note 1900-0730  D) Patient presents anxious but engaged well with Clinical research associate. Patient states, "I think my Prozac and Abilify should be increased. I don't know, I still felt depressed and anxious today". Medication education provided, and patient stated, "Maybe I should give it more time to work". Patient reports he will talk to providers in the morning about medication regimen. Patient appears blunted but was up in the dayroom playing chess with peers. Patient did attend group. Patient denies SI/HI or pain. Patient contracts for safety on the unit. Patient requests trazodone for sleep this evening.  A) Emotional support given. 1:1 interaction and active listening provided. Patient medicated as prescribed. Medications and plan of care reviewed with patient. Patient verbalized understanding without further questions. Snacks and fluids provided. Opportunities for questions or concerns presented to patient. Patient encouraged to continue to work on treatment goals. Labs, vital signs and patient behavior monitored throughout shift. Patient safety maintained with q15 min safety checks. Low fall risk precautions in place and reviewed with patient; patient verbalized understanding.  R) Patient receptive to interaction with nurse. Patient remains safe on the unit at this time. Patient denies any adverse medication reactions at this time. Patient is resting in bed without complaints. Will continue to monitor.

## 2017-06-08 NOTE — Progress Notes (Signed)
Nursing Progress Note 1900-0730  D) Patient presents with pleasant mood and animated affect. Patient is observed up in the dayroom laughing with peers. Patient reports he is feeling better but continues to report hearing voices. Patient denies SI/HI or pain. Patient contracts for safety on the unit. Patient reports sleeping well with current regimen. Patient compliant with medications.  A) Emotional support given. 1:1 interaction and active listening provided. Patient medicated as prescribed. Medications and plan of care reviewed with patient. Patient verbalized understanding without further questions. Snacks and fluids provided. Opportunities for questions or concerns presented to patient. Patient encouraged to continue to work on treatment goals. Labs, vital signs and patient behavior monitored throughout shift. Patient safety maintained with q15 min safety checks. Low fall risk precautions in place and reviewed with patient; patient verbalized understanding.  R) Patient receptive to interaction with nurse. Patient remains safe on the unit at this time. Patient denies any adverse medication reactions at this time. Patient is resting in bed without complaints. Will continue to monitor.

## 2017-06-08 NOTE — [facility] Group Notes (Signed)
[facility] LCSW Group Therapy Note    06/08/2017 at 10:15 AM   Type of Therapy and Topic: Group Therapy: Feelings Around Returning Home & Establishing a Supportive Framework and Activity to Identify signs of Improvement or Decompensation   Participation Level: Active    Description of Group:  Patients first processed thoughts and feelings about up coming discharge. These included fears of upcoming changes, lack of change, new living environments, judgements and expectations from others and overall stigma of MH issues. We then discussed what is a supportive framework? What does it look like feel like and how do I discern it from and unhealthy non-supportive network? Learn how to cope when supports are not helpful and don't support you. Discuss what to do when your family/friends are not supportive.   Therapeutic Goals Addressed in Processing Group:  1. Patient will identify one healthy supportive network that they can use at discharge. 2. Patient will identify one factor of a supportive framework and how to tell it from an unhealthy network. 3. Patient able to identify one coping skill to use when they do not have positive supports from others. 4. Patient will demonstrate ability to communicate their needs through discussion and/or role plays.  Summary of Patient Progress:  Pt engaged easily during group session. As patients processed their anxiety about discharge and described healthy supports patient  Shared importance of someone who listens without judgement. Patient chose a visual to represent decompensation as disassociation and improvement as connection with people and the outdoors  Carney Bern, LCSW

## 2017-06-08 NOTE — [facility] Group Notes (Signed)
[facility] Group Note- Nursing Education Group [id number]  Today's group patient's were asked to introduce themselves, why they are here, and one thing they would like to learn about.  A list of the topics/questions was created on the marker board.  The nurse answered the questions as appropriate with education, or referred the patient to the proper discipline to address his or her question.  Topics discussed include mental health diagnoses (bipolar I & II, depression, schizophrenia, and schizoaffective disorders), and various other topics.  Patient attended for duration of group and actively participated. 

## 2017-06-08 NOTE — Progress Notes (Signed)
Nursing Note 06/08/2017 [id number]  Data Reports sleeping good with PRN sleep med.  Rates depression 4/10, hopelessness 0/10, and anxiety 5/10. Affect blunted but appropriate.  Denies HI, SI. Endorses AH.  Attending groups, polite and appropriate.  Remains safe on unit.  Action Spoke with patient 1:1, nurse offered support to patient throughout shift.  Continues to be monitored on 15 minute checks for safety.  Response Remains safe on unit.

## 2017-06-08 NOTE — Plan of Care (Signed)
Problem: Activity: Goal: Sleeping patterns will improve Outcome: Progressing Patient reports sleeping well with current medication regimen and takes his Trazodone for sleep.

## 2017-06-08 NOTE — Progress Notes (Signed)
[facility] MD Progress Note  06/08/2017 3:22 PM Timothy Patterson  MRN:  [id number]   Subjective:  Patient reports that he is feeling much better. He reports his sleep and appetite are both good. He rates his depression at 3/10 and his anxiety at 3/10. He denies any SI/HI/AVH. He wishes to continue his current medications.   Objective: Patient's chart and findings reviewed and discussed with treatment team. Patient has been pleasant and cooperative. Patient has been seen in the day room interacting with other and attending groups. Patient has started stabilizing per reports and will consider discharge tomorrow.   Principal Problem: Psychosis (HCC) Diagnosis:   Patient Active Problem List   Diagnosis Date Noted  . Psychosis (HCC) [F29] 06/05/2017  . Alcohol use disorder, mild, abuse [F10.10] 04/22/2017  . Cannabis use disorder, moderate, dependence (HCC) [F12.20] 04/22/2017  . Immunity status testing [Z01.84] 04/16/2017  . Need for meningitis vaccination [Z23] 04/16/2017   Total Time spent with patient: 25 minutes  Past Psychiatric History: See H&P  Past Medical History:  Past Medical History:  Diagnosis Date  . ADHD (attention deficit hyperactivity disorder)    HX of ADHD  . Anxiety     Past Surgical History:  Procedure Laterality Date  . ingrown toe nail     Family History:  Family History  Problem Relation Age of Onset  . Depression Mother   . Anxiety disorder Mother   . Bipolar disorder Mother   . Depression Sister   . Anxiety disorder Sister   . Hyperlipidemia Paternal Grandfather   . Bipolar disorder Maternal Grandmother   . Depression Maternal Grandmother    Family Psychiatric  History: See H&P Social History:  History  Alcohol Use  . 0.0 oz/week    Comment: occasional     History  Drug Use No    Social History   Social History  . Marital status: Single    Spouse name: N/A  . Number of children: N/A  . Years of education: N/A   Social History Main  Topics  . Smoking status: Current Some Day Smoker    Types: Cigarettes  . Smokeless tobacco: Never Used     Comment: not often  . Alcohol use 0.0 oz/week     Comment: occasional  . Drug use: No  . Sexual activity: No   Other Topics Concern  . None   Social History Narrative  . None   Additional Social History:                         Sleep: Good  Appetite:  Good  Current Medications: Current Facility-Administered Medications  Medication Dose Route Frequency Provider Last Rate Last Dose  . acetaminophen (TYLENOL) tablet 650 mg  650 mg Oral Q6H PRN Charm Rings, NP      . alum & mag hydroxide-simeth (MAALOX/MYLANTA) 200-200-20 MG/5ML suspension 30 mL  30 mL Oral Q4H PRN Charm Rings, NP      . ARIPiprazole (ABILIFY) tablet 10 mg  10 mg Oral Daily Money, Gerlene Burdock, FNP   10 mg at 06/08/17 0754  . escitalopram (LEXAPRO) tablet 5 mg  5 mg Oral Daily Satsuki Zillmer, Rockey Situ, MD   5 mg at 06/08/17 0800  . LORazepam (ATIVAN) tablet 0.5 mg  0.5 mg Oral Q6H PRN Sharay Bellissimo A, MD      . magnesium hydroxide (MILK OF MAGNESIA) suspension 30 mL  30 mL Oral Daily PRN Charm Rings,  NP      . nicotine polacrilex (NICORETTE) gum 2 mg  2 mg Oral PRN Yuritzy Zehring, Rockey Situ, MD   2 mg at 06/07/17 0756  . traZODone (DESYREL) tablet 100 mg  100 mg Oral QHS PRN Money, Gerlene Burdock, FNP   100 mg at 06/07/17 2125    Lab Results: No results found for this or any previous visit (from the past 48 hour(s)).  Blood Alcohol level:  Lab Results  Component Value Date   ETH <5 06/04/2017    Metabolic Disorder Labs: Lab Results  Component Value Date   HGBA1C 4.5 (L) 06/06/2017   MPG 82.45 06/06/2017   Lab Results  Component Value Date   PROLACTIN 20.6 (H) 06/06/2017   Lab Results  Component Value Date   CHOL 99 06/06/2017   TRIG 56 06/06/2017   HDL 39 (L) 06/06/2017   CHOLHDL 2.5 06/06/2017   VLDL 11 06/06/2017   LDLCALC 49 06/06/2017    Physical Findings: AIMS: Facial and Oral  Movements Muscles of Facial Expression: None, normal Lips and Perioral Area: None, normal Jaw: None, normal Tongue: None, normal,Extremity Movements Upper (arms, wrists, hands, fingers): None, normal Lower (legs, knees, ankles, toes): None, normal, Trunk Movements Neck, shoulders, hips: None, normal, Overall Severity Severity of abnormal movements (highest score from questions above): None, normal Incapacitation due to abnormal movements: None, normal Patient's awareness of abnormal movements (rate only patient's report): No Awareness, Dental Status Current problems with teeth and/or dentures?: No Does patient usually wear dentures?: No  CIWA:    COWS:     Musculoskeletal: Strength & Muscle Tone: within normal limits Gait & Station: normal Patient leans: N/A  Psychiatric Specialty Exam: Physical Exam  Nursing note and vitals reviewed. Constitutional: He is oriented to person, place, and time. He appears well-developed and well-nourished.  Cardiovascular: Normal rate.   Respiratory: Effort normal.  Musculoskeletal: Normal range of motion.  Neurological: He is alert and oriented to person, place, and time.  Skin: Skin is warm.    Review of Systems  Constitutional: Negative.   HENT: Negative.   Eyes: Negative.   Respiratory: Negative.   Cardiovascular: Negative.   Gastrointestinal: Negative.   Genitourinary: Negative.   Musculoskeletal: Negative.   Skin: Negative.   Neurological: Negative.   Endo/Heme/Allergies: Negative.   Psychiatric/Behavioral: Positive for depression.    Blood pressure 101/67, pulse 90, temperature (!) 97.5 F (36.4 C), temperature source Oral, resp. rate 18, height  (1.753 m), weight 67.1 kg (148 lb), SpO2 98 %.Body mass index is 21.86 kg/m.  General Appearance: Casual  Eye Contact:  Good  Speech:  Clear and Coherent and Normal Rate  Volume:  Normal  Mood:  Depressed  Affect:  Flat  Thought Process:  Coherent and Descriptions of  Associations: Intact  Orientation:  Full (Time, Place, and Person)  Thought Content:  WDL  Suicidal Thoughts:  No  Homicidal Thoughts:  No  Memory:  Immediate;   Good Recent;   Good Remote;   Good  Judgement:  Good  Insight:  Good  Psychomotor Activity:  Normal  Concentration:  Concentration: Good and Attention Span: Good  Recall:  Good  Fund of Knowledge:  Good  Language:  Good  Akathisia:  No  Handed:  Right  AIMS (if indicated):     Assets:  Communication Skills Desire for Improvement Financial Resources/Insurance Social Support Transportation  ADL's:  Intact  Cognition:  WNL  Sleep:  Number of Hours: 6     Treatment  Plan Summary: Daily contact with patient to assess and evaluate symptoms and progress in treatment, Medication management and Plan is to:  -Continue Abilify 10 mg PO Daily for mood stability -Continue Lexapro 5 mg PO Daily for mood stability -Continue Trazodone 100 mg PO QHS PRN for insomnia -Continue Ativan 0.5 mg PO Q6H PRN for anxiety -Encourage group therapy participation  Maryfrances Bunnell, FNP 06/08/2017, 3:22 PM   Agree with NP Progress Note

## 2017-06-08 NOTE — Progress Notes (Signed)
Patient attended wrap-up group and said that his day was a 10.  His coping skills were taking a nap, reading and playing chest.

## 2017-06-09 ENCOUNTER — Ambulatory Visit (HOSPITAL_COMMUNITY): Payer: BC Managed Care – PPO | Admitting: Psychiatry

## 2017-06-09 DIAGNOSIS — F1010 Alcohol abuse, uncomplicated: Secondary | ICD-10-CM

## 2017-06-09 DIAGNOSIS — F4010 Social phobia, unspecified: Secondary | ICD-10-CM

## 2017-06-09 DIAGNOSIS — F1220 Cannabis dependence, uncomplicated: Secondary | ICD-10-CM

## 2017-06-09 DIAGNOSIS — R451 Restlessness and agitation: Secondary | ICD-10-CM

## 2017-06-09 DIAGNOSIS — R45851 Suicidal ideations: Secondary | ICD-10-CM

## 2017-06-09 MED ORDER — ESCITALOPRAM OXALATE 5 MG PO TABS: 5.0000 mg | ORAL_TABLET | Freq: Every day | ORAL | 0 refills | Status: DC

## 2017-06-09 MED ORDER — TRAZODONE HCL 100 MG PO TABS: 100.0000 mg | ORAL_TABLET | Freq: Every evening | ORAL | 0 refills | Status: DC | PRN

## 2017-06-09 MED ORDER — ARIPIPRAZOLE 10 MG PO TABS: 10.0000 mg | ORAL_TABLET | Freq: Every day | ORAL | 0 refills | Status: DC

## 2017-06-09 NOTE — Discharge Summary (Signed)
Physician Discharge Summary Note  Patient:  Timothy Patterson is an 21 y.o., male MRN:  [id number] DOB:  [birth year] Patient phone:  [phone] (home)  Patient address:   [address] [place] [address],  Total Time spent with patient: 20 minutes  Date of Admission:  06/05/2017 Date of Discharge: 06/09/17   Reason for Admission:  Psychosis  Principal Problem: Psychosis [facility]) Discharge Diagnoses: Patient Active Problem List   Diagnosis Date Noted  . Psychosis (HCC) [F29] 06/05/2017  . Alcohol use disorder, mild, abuse [F10.10] 04/22/2017  . Cannabis use disorder, moderate, dependence (HCC) [F12.20] 04/22/2017  . Immunity status testing [Z01.84] 04/16/2017  . Need for meningitis vaccination [Z23] 04/16/2017    Past Psychiatric History: reports chronic depression, denies history of mania, reports auditory hallucinations, which  started 11/17. Reports anxiety, mainly characterized mostly as panic attacks. No prior psychiatric admissions. Denies history of  suicidal attempts. He does have a history of self cutting, but not in a few years  Past Medical History:  Past Medical History:  Diagnosis Date  . ADHD (attention deficit hyperactivity disorder)    HX of ADHD  . Anxiety     Past Surgical History:  Procedure Laterality Date  . ingrown toe nail     Family History:  Family History  Problem Relation Age of Onset  . Depression Mother   . Anxiety disorder Mother   . Bipolar disorder Mother   . Depression Sister   . Anxiety disorder Sister   . Hyperlipidemia Paternal Grandfather   . Bipolar disorder Maternal Grandmother   . Depression Maternal Grandmother    Family Psychiatric  History: As listed above, denies any family suicides Social History:  History  Alcohol Use  . 0.0 oz/week    Comment: occasional     History  Drug Use No    Social History   Social History  . Marital status: Single    Spouse name: N/A  . Number of children: N/A  . Years  of education: N/A   Social History Main Topics  . Smoking status: Current Some Day Smoker    Types: Cigarettes  . Smokeless tobacco: Never Used     Comment: not often  . Alcohol use 0.0 oz/week     Comment: occasional  . Drug use: No  . Sexual activity: No   Other Topics Concern  . None   Social History Narrative  . None    Hospital Course:  ED Assessment 06/04/17: 21 y.o. male who presents to the ED voluntarily accompanied by his parents. Pt provided verbal consent to this writer for his parents to be present during the assessment. Pt presents due to ongoing hallucinations that are worsening over the past several months. Pt reports the AVH began in November 2017. Pt states the voices initially were "attacking his insecurities", however he reports the hallucinations have progressed to "speaking nonsense and making him feel like his reality is melting." Pt reports he recently felt as though he had a demon inside of him that he felt "possessed him". Pt reports he felt the demon about 3 weeks ago and he "attempted to sabotage his life." Pt states he has not felt any demonic presence in about 2 weeks.  Pt's mother reports she has battled with depression and the pt's maternal grandmother has a Bipolar diagnosis. Pt's father reports he noticed the pt has anxiety and depression and he stated "this has been ongoing since he was a kid in middle school." Pt's mother reports  she feels the pt is easily agitated and has social anxiety when he has to be around other. During the assessment the pt stated to his mother "I have to respectfully disagree with you because I do not have any issues with anger, people put me in those situations and my response is a result of my circumstance."  Pt reports he was attending college at [employer] but reports he withdrew today and has to move off campus by this 06/06/17. Pt recently began seeking OPT from Dr. Gaspar Skeeters, MD and has an upcoming appointment on 06/07/17. Pt  reports he is prescribed Seroquel but he does not take the medication consistently. Pt reports he feels the medication makes him sleepy which is why he does not want to take it.  Pt denies SI or HI at this time. Pt has hx of cutting behaviors but denies any recent incident. Pt reports his last cutting behavior was in high school. Pt reports some drug use but he states he does not feel he is an issue with drugs. Pt does endorse marijuana use and reports he has used mushrooms and consumed alcohol in the past. Pt denies that his drug use interferes with his daily life.  06/05/17 Initial Assessment at [facility]: 21 year old single male, who recently started college , studying theatre. Presented to the ED voluntarily due to auditory hallucinations. Reports these have critical ,demeaning at times and at times unintelligible, non- sensical. For example he states " like this voice was randomly talking about umbrellas the other day". Denies any command hallucinations. He also reports he has been feeling vaguely paranoid, states " like that people know my insecurities and judge me". In addition to psychotic symptoms endorses chronic depression , starting as a child . He states " I continue to feel depressed, but I am not that sure it has been worsening". He  endorses neuro-vegetative symptoms as below, but denies suicidal ideations. He reports he has been feeling worse over the last several days to weeks, and have been negatively impacting his academic and social functioning. Reports history of cannabis abuse, but states he has been using less than in the past few months - admission BAL <5, admission UDS positive for Cannabis.  Patient remained on the unit for 4 days and stabilized. He was stabilized on medications and therapy. Patient was started on  Abilify 10 mg Daily, Lexapro 5 mg Daily, and Trazodone 100 mg QHS PRN for sleep. Patient showed improvement by expressing no hallucinations. He shown improvement in sleep, mood,  affect, appetite, and interaction. Patient attended group and participated. Patient has planned to go home with I=his dad and live there. His dad was contacted and this was confirmed. Patient will be following up with [facility]. Patient was provided with prescriptions for medications upon discharge. Patient denies any SI/HI/AVH and depression or anxiety.   Physical Findings: AIMS: Facial and Oral Movements Muscles of Facial Expression: None, normal Lips and Perioral Area: None, normal Jaw: None, normal Tongue: None, normal,Extremity Movements Upper (arms, wrists, hands, fingers): None, normal Lower (legs, knees, ankles, toes): None, normal, Trunk Movements Neck, shoulders, hips: None, normal, Overall Severity Severity of abnormal movements (highest score from questions above): None, normal Incapacitation due to abnormal movements: None, normal Patient's awareness of abnormal movements (rate only patient's report): No Awareness, Dental Status Current problems with teeth and/or dentures?: No Does patient usually wear dentures?: No  CIWA:    COWS:     Musculoskeletal: Strength &  Muscle Tone: within normal limits Gait & Station: normal Patient leans: N/A  Psychiatric Specialty Exam: Physical Exam  Nursing note and vitals reviewed. Constitutional: He is oriented to person, place, and time. He appears well-developed and well-nourished.  Cardiovascular: Normal rate.   Respiratory: Effort normal.  Musculoskeletal: Normal range of motion.  Neurological: He is alert and oriented to person, place, and time.  Skin: Skin is warm.    Review of Systems  Constitutional: Negative.   HENT: Negative.   Eyes: Negative.   Respiratory: Negative.   Cardiovascular: Negative.   Gastrointestinal: Negative.   Genitourinary: Negative.   Musculoskeletal: Negative.   Skin: Negative.   Neurological: Negative.   Endo/Heme/Allergies: Negative.     Blood pressure  101/67, pulse 90, temperature (!) 97.5 F (36.4 C), temperature source Oral, resp. rate 18, height  (1.753 m), weight 67.1 kg (148 lb), SpO2 98 %.Body mass index is 21.86 kg/m.  General Appearance: Casual  Eye Contact:  Good  Speech:  Clear and Coherent and Normal Rate  Volume:  Normal  Mood:  Euthymic  Affect:  Appropriate  Thought Process:  Coherent and Descriptions of Associations: Intact  Orientation:  Full (Time, Place, and Person)  Thought Content:  WDL  Suicidal Thoughts:  No  Homicidal Thoughts:  No  Memory:  Immediate;   Good Recent;   Good Remote;   Good  Judgement:  Good  Insight:  Good  Psychomotor Activity:  Normal  Concentration:  Concentration: Good and Attention Span: Good  Recall:  Good  Fund of Knowledge:  Good  Language:  Good  Akathisia:  No  Handed:  Right  AIMS (if indicated):     Assets:  Communication Skills Desire for Improvement Housing Physical Health Social Support Transportation  ADL's:  Intact  Cognition:  WNL  Sleep:  Number of Hours: 6.75     Have you used any form of tobacco in the last 30 days? (Cigarettes, Smokeless Tobacco, Cigars, and/or Pipes): Yes  Has this patient used any form of tobacco in the last 30 days? (Cigarettes, Smokeless Tobacco, Cigars, and/or Pipes) Yes, Yes, A prescription for an FDA-approved tobacco cessation medication was offered at discharge and the patient refused  Blood Alcohol level:  Lab Results  Component Value Date   ETH <5 06/04/2017    Metabolic Disorder Labs:  Lab Results  Component Value Date   HGBA1C 4.5 (L) 06/06/2017   MPG 82.45 06/06/2017   Lab Results  Component Value Date   PROLACTIN 20.6 (H) 06/06/2017   Lab Results  Component Value Date   CHOL 99 06/06/2017   TRIG 56 06/06/2017   HDL 39 (L) 06/06/2017   CHOLHDL 2.5 06/06/2017   VLDL 11 06/06/2017   LDLCALC 49 06/06/2017    See Psychiatric Specialty Exam and Suicide Risk Assessment completed by Attending Physician prior  to discharge.  Discharge destination:  Home  Is patient on multiple antipsychotic therapies at discharge:  No   Has Patient had three or more failed trials of antipsychotic monotherapy by history:  No  Recommended Plan for Multiple Antipsychotic Therapies: NA   Allergies as of 06/09/2017   No Known Allergies     Medication List    STOP taking these medications   QUEtiapine 50 MG tablet Commonly known as:  SEROQUEL     TAKE these medications     Indication  ARIPiprazole 10 MG tablet Commonly known as:  ABILIFY Take 1 tablet (10 mg total) by mouth daily. For mood control  Indication:  mood stability   escitalopram 5 MG tablet Commonly known as:  LEXAPRO Take 1 tablet (5 mg total) by mouth daily. For mood control  Indication:  mood stability   traZODone 100 MG tablet Commonly known as:  DESYREL Take 1 tablet (100 mg total) by mouth at bedtime as needed for sleep.  Indication:  Trouble Sleeping      Follow-up Information    [facility] Follow up.   Specialty:  Behavioral Health Why:  Social worker will call to schedule yoru appointments for therapy with a new provider and medication management with Dr. Rene Kocher.  Contact information: [address] [place] [address] [phone]          Follow-up recommendations:  Continue activity as tolerated. Continue diet as recommended by your PCP. Ensure to keep all appointments with outpatient providers.  Comments:  Patient is instructed prior to discharge to: Take all medications as prescribed by his/her mental healthcare provider. Report any adverse effects and or reactions from the medicines to his/her outpatient provider promptly. Patient has been instructed & cautioned: To not engage in alcohol and or illegal drug use while on prescription medicines. In the event of worsening symptoms, patient is instructed to call the crisis hotline, 911 and or go to the  nearest ED for appropriate evaluation and treatment of symptoms. To follow-up with his/her primary care provider for your other medical issues, concerns and or health care needs.    Signed: Gerlene Burdock Money, FNP 06/09/2017, 10:46 AM   Patient seen, Suicide Assessment Completed.  Disposition Plan Reviewed

## 2017-06-09 NOTE — Progress Notes (Signed)
Nursing Discharge Note 06/09/2017 [id number]  Data Reports sleeping good with PRN sleep med.  Rates depression 3/10, hopelessness 0/10, and anxiety 4/10. Affect blunted but appropriate.  Denies HI, SI, AVH.  Received discharge orders.  Action Spoke with patient 1:1, nurse offered support to patient throughout shift.  Reviewed medications, discharge instructions, and follow up appointments with patient. Medication scripts reviewed and given to patient.  Paperwork, AVS, SRA, and transition record handed to patient.   Escorted off of unit at 1240. Belongings returned per belongings form.  Discharged to lobby where he was met by family.    Response Verbalized understanding of discharge teaching. Agrees to contact someone or 911 with thoughts/intent to harm self or others.    To follow up per AVS.  .

## 2017-06-09 NOTE — Progress Notes (Signed)
  [facility] Adult Case Management Discharge Plan :  Will you be returning to the same living situation after discharge:  Yes,  Pt returning ho me At discharge, do you have transportation home?: Yes,  Pt family to pick up Do you have the ability to pay for your medications: Yes,  Pt provided with prescriptions  Release of information consent forms completed and in the chart;  Patient's signature needed at discharge.  Patient to Follow up at: Follow-up Information    [facility] Follow up.   Specialty:  Behavioral Health Why:  Continue with Dr. Rene Kocher. CSW cancelled your appointment for today. Please reschedule when you start the IOP program. Contact information: [address] [place] [address] [phone]       [facility] Follow up on 06/17/2017.   Specialty:  Behavioral Health Why:  at 8:30am for you initial assessment for intensive outpatient program. Contact information: [address] [place] [address] [phone] mc [place] [address] [phone]          Next level of care provider has access to [facility] Link:yes  Safety Planning and Suicide Prevention discussed: Yes,  with mother; see SPE note  Have you used any form of tobacco in the last 30 days? (Cigarettes, Smokeless Tobacco, Cigars, and/or Pipes): Yes  Has patient been referred to the Quitline?: Patient refused referral  Patient has been referred for addiction treatment: Yes  Verdene Lennert, LCSW 06/09/2017, 12:13 PM

## 2017-06-09 NOTE — BHH Suicide Risk Assessment (Signed)
[facility] Discharge Suicide Risk Assessment   Principal Problem: Psychosis [facility]) Discharge Diagnoses:  Patient Active Problem List   Diagnosis Date Noted  . Psychosis (HCC) [F29] 06/05/2017  . Alcohol use disorder, mild, abuse [F10.10] 04/22/2017  . Cannabis use disorder, moderate, dependence (HCC) [F12.20] 04/22/2017  . Immunity status testing [Z01.84] 04/16/2017  . Need for meningitis vaccination [Z23] 04/16/2017    Total Time spent with patient: 30 minutes  Musculoskeletal: Strength & Muscle Tone: within normal limits Gait & Station: normal Patient leans: N/A  Psychiatric Specialty Exam: ROS denies headache, no chest pain, no shortness of breath, no vomiting, no fever  Blood pressure 101/67, pulse 90, temperature (!) 97.5 F (36.4 C), temperature source Oral, resp. rate 18, height  (1.753 m), weight 67.1 kg (148 lb), SpO2 98 %.Body mass index is 21.86 kg/m.  General Appearance: improved grooming   Eye Contact::  Good  Speech:  Normal Rate409  Volume:  Normal  Mood:  improved mood, states he feels well at this time, presents euthymic  Affect:  Appropriate and Full Range  Thought Process:  Linear and Descriptions of Associations: Intact  Orientation:  Other:  fully alert and attentive  Thought Content:  denies hallucinations, no delusions, not internally preoccupied - today not expressing any delusional ideations  Suicidal Thoughts:  No denies suicidal or self injurious ideations, denies homicidal or violent ideations   Homicidal Thoughts:  No  Memory:  recent and remote grossly intact   Judgement:  Other:  improving   Insight:  Fair and improving   Psychomotor Activity:  Normal  Concentration:  Good  Recall:  Good  Fund of Knowledge:Good  Language: Good  Akathisia:  No  Handed:  Right  AIMS (if indicated):   does not endorse or present with abnormal movements at this time  Assets:  Communication Skills Desire for Improvement Resilience  Sleep:  Number of Hours:  6.75  Cognition: WNL  ADL's:  Improved    Mental Status Per Nursing Assessment::   On Admission:     Demographic Factors:  21 year old single male, college student   Loss Factors: No acute stressors identified   Historical Factors: History of hallucinations, paranoid ideations, depression No prior psychiatric admissions, no history of suicide attempts  History of Cannabis Abuse   Risk Reduction Factors:   Sense of responsibility to family, Living with another person, especially a relative and Positive coping skills or problem solving skills  Continued Clinical Symptoms:  Patient presents improved. At this time alert, attentive, improved grooming, mood is improved and currently denies feeling depressed, presents euthymic, affect is appropriate, reactive, does not present guarded or suspicious at this time, no thought disorder , thought process linear, no suicidal or self injurious ideations, no homicidal or violent ideations, denies hallucinations at this time, and does not present internally preoccupied. No delusions are expressed . He is future oriented. Has been visible on unit, interacting appropriately with peers, exhibiting full range of affect. No disruptive behaviors, pleasant on approach. Denies medication side effects. With patient's express consent I have spoken with his father via phone who corroborates improvement and is in agreement with discharge today.  Cognitive Features That Contribute To Risk:  No gross cognitive deficits noted upon discharge. Is alert , attentive, and oriented x 3   Suicide Risk:  Mild:  Suicidal ideation of limited frequency, intensity, duration, and specificity.  There are no identifiable plans, no associated intent, mild dysphoria and related symptoms, good self-control (both objective and  subjective assessment), few other risk factors, and identifiable protective factors, including available and accessible social support.  Follow-up  Information    [facility] Follow up.   Specialty:  Behavioral Health Why:  Social worker will call to schedule yoru appointments for therapy with a new provider and medication management with Dr. Rene Kocher.  Contact information: [address] [place] [address] [phone]          Plan Of Care/Follow-up recommendations:  Activity:  as tolerated  Diet:  regular Tests:  NA Other:  see below  Patient is expressing readiness for discharge and is leaving unit in good spirits. Plans to follow up with IOP.  We have encouraged and stressed importance of abstaining from cannabis and other illicit drugs .   Craige Cotta, MD 06/09/2017, 12:10 PM

## 2017-06-09 NOTE — Progress Notes (Signed)
Recreation Therapy Notes  Date: 06/09/17 Time: 0930 Location: [address] Dayroom  Group Topic: Stress Management  Goal Area(s) Addresses:  Patient will verbalize importance of using healthy stress management.  Patient will identify positive emotions associated with healthy stress management.   Behavioral Response: Engaged  Intervention: Stress Management  Activity :  Meditation.  LRT introduced the stress management technique of meditation.  LRT played a meditation from the Calm app that explained stress and how we take on and exhibit stress throughout the body.  Patients were to listen and follow along as the meditation played to engage in the technique.  Education:  Stress Management, Discharge Planning.   Education Outcome: Acknowledges edcuation/In group clarification offered/Needs additional education  Clinical Observations/Feedback: Pt attended group.    Caroll Rancher, LRT/CTRS         Caroll Rancher A 06/09/2017 12:27 PM

## 2017-07-11 ENCOUNTER — Encounter (HOSPITAL_COMMUNITY): Payer: Self-pay | Admitting: Psychiatry

## 2017-07-11 ENCOUNTER — Ambulatory Visit (INDEPENDENT_AMBULATORY_CARE_PROVIDER_SITE_OTHER): Payer: BC Managed Care – PPO | Admitting: Psychiatry

## 2017-07-11 VITALS — BP 140/80 | HR 85 | Ht 69.0 in | Wt 161.8 lb

## 2017-07-11 DIAGNOSIS — F331 Major depressive disorder, recurrent, moderate: Secondary | ICD-10-CM | POA: Diagnosis not present

## 2017-07-11 DIAGNOSIS — Z818 Family history of other mental and behavioral disorders: Secondary | ICD-10-CM

## 2017-07-11 DIAGNOSIS — F17210 Nicotine dependence, cigarettes, uncomplicated: Secondary | ICD-10-CM

## 2017-07-11 DIAGNOSIS — F1220 Cannabis dependence, uncomplicated: Secondary | ICD-10-CM

## 2017-07-11 DIAGNOSIS — F29 Unspecified psychosis not due to a substance or known physiological condition: Secondary | ICD-10-CM | POA: Diagnosis not present

## 2017-07-11 DIAGNOSIS — F1010 Alcohol abuse, uncomplicated: Secondary | ICD-10-CM

## 2017-07-11 MED ORDER — TRAZODONE HCL 100 MG PO TABS: ORAL_TABLET | ORAL | 3 refills | Status: DC

## 2017-07-11 MED ORDER — BUPROPION HCL ER (XL) 150 MG PO TB24: 150.0000 mg | ORAL_TABLET | ORAL | 3 refills | Status: DC

## 2017-07-11 NOTE — Progress Notes (Signed)
BH MD/PA/NP OP Progress Note  07/11/2017 1:03 PM Timothy Patterson  MRN:  [id number]  Chief Complaint: Med management HPI: Timothy Patterson presents after psychiatric hospitalization for med management.  The patient presents as much less paranoid and guarded than her prior interactions.  He reports that he has been off of Abilify for about 4-5 days.  He reports that he felt like his mood was worse on the Abilify and Lexapro.  He reports that the trazodone does help him sleep so he continues to take that.  He denies any suicidal thoughts and reports that he had one brief thought about suicide about a week ago, but was able to dismiss this immediately.  He reports that his primary concern today is related to attention, concentration, and depression.  He wishes to consider restarting stimulants.  He continues to use marijuana about 1-2 times per week.  I educated him that given his history of cannabis-induced psychosis, we will absolutely not be restarting any stimulant in the context of ongoing cannabis use, and I would require him to be sober from cannabis for 3 months which we would check with urine drug screen.  He was receptive and agreeable to this.  He asks if there are other alternative medications that can help with his energy and depression.  He has never been on Wellbutrin and I reviewed the risks and benefits of Wellbutrin, confirmed that he has no history of seizures, and reviewed that Wellbutrin may increase anxiety, paranoia, or hallucinations, and if he was to have any of the side effects to discontinue immediately and contact this office.  He was agreeable to this and reports that he has not been having any of the psychotic symptoms that he had previously experienced.  Visit Diagnosis:    ICD-10-CM   1. Psychosis, unspecified psychosis type (HCC) F29   2. Cannabis use disorder, moderate, dependence (HCC) F12.20   3. Alcohol use disorder, mild, abuse F10.10   4. Moderate episode  of recurrent major depressive disorder (HCC) F33.1 buPROPion (WELLBUTRIN XL) 150 MG 24 hr tablet    traZODone (DESYREL) 100 MG tablet    Past Psychiatric History: See intake H&P for full details. Reviewed, with no updates at this time.   Past Medical History:  Past Medical History:  Diagnosis Date  . ADHD (attention deficit hyperactivity disorder)    HX of ADHD  . Anxiety     Past Surgical History:  Procedure Laterality Date  . ingrown toe nail      Family Psychiatric History: See intake H&P for full details. Reviewed, with no updates at this time.   Family History:  Family History  Problem Relation Age of Onset  . Depression Mother   . Anxiety disorder Mother   . Bipolar disorder Mother   . Depression Sister   . Anxiety disorder Sister   . Hyperlipidemia Paternal Grandfather   . Bipolar disorder Maternal Grandmother   . Depression Maternal Grandmother     Social History:  Social History   Socioeconomic History  . Marital status: Single    Spouse name: None  . Number of children: None  . Years of education: None  . Highest education level: None  Social Needs  . Financial resource strain: None  . Food insecurity - worry: None  . Food insecurity - inability: None  . Transportation needs - medical: None  . Transportation needs - non-medical: None  Occupational History  . None  Tobacco Use  . Smoking status: Current  Some Day Smoker    Types: Cigarettes  . Smokeless tobacco: Never Used  . Tobacco comment: not often  Substance and Sexual Activity  . Alcohol use: Yes    Alcohol/week: 0.0 oz    Comment: occasional  . Drug use: Yes    Frequency: 1.0 times per week    Types: Marijuana  . Sexual activity: No  Other Topics Concern  . None  Social History Narrative  . None    Allergies: No Known Allergies  Metabolic Disorder Labs: Lab Results  Component Value Date   HGBA1C 4.5 (L) 06/06/2017   MPG 82.45 06/06/2017   Lab Results  Component Value Date    PROLACTIN 20.6 (H) 06/06/2017   Lab Results  Component Value Date   CHOL 99 06/06/2017   TRIG 56 06/06/2017   HDL 39 (L) 06/06/2017   CHOLHDL 2.5 06/06/2017   VLDL 11 06/06/2017   LDLCALC 49 06/06/2017   No results found for: TSH  Therapeutic Level Labs: No results found for: LITHIUM No results found for: VALPROATE No components found for:  CBMZ  Current Medications: Current Outpatient Medications  Medication Sig Dispense Refill  . buPROPion (WELLBUTRIN XL) 150 MG 24 hr tablet Take 1 tablet (150 mg total) every morning by mouth. 30 tablet 3  . traZODone (DESYREL) 100 MG tablet Take 1-2 tablets at bedtime for sleep 60 tablet 3   No current facility-administered medications for this visit.      Musculoskeletal: Strength & Muscle Tone: within normal limits Gait & Station: normal Patient leans: N/A  Psychiatric Specialty Exam: ROS  Blood pressure 140/80, pulse 85, height 5\' 9"  (1.753 m), weight 161 lb 12.8 oz (73.4 kg).Body mass index is 23.89 kg/m.  General Appearance: Casual and Fairly Groomed  Eye Contact:  Fair  Speech:  Clear and Coherent  Volume:  Normal  Mood:  Dysphoric  Affect:  Congruent  Thought Process:  Coherent, Goal Directed and Descriptions of Associations: Intact  Orientation:  Full (Time, Place, and Person)  Thought Content: Logical   Suicidal Thoughts:  No  Homicidal Thoughts:  No  Memory:  Immediate;   Fair  Judgement:  Fair  Insight:  Shallow  Psychomotor Activity:  Normal  Concentration:  Concentration: Fair  Recall:  [employer] of Knowledge: Fair  Language: Fair  Akathisia:  Negative  Handed:  Right  AIMS (if indicated): not done  Assets:  Communication Skills Desire for Improvement Financial Resources/Insurance Housing  ADL's:  Intact  Cognition: WNL  Sleep:  Fair   Screenings: AIMS     [facility]  AIMS Total Score  0    AUDIT     Ad[facility]  Alcohol Use Disorder Identification Test Final Score (AUDIT)  2    PHQ2-9     Office Visit from 04/11/2017 in Primary Care at [facility] Visit from 04/09/2017 in Primary Care at [facility] Total Score  2  0  PHQ-9 Total Score  8  No data     Assessment and Plan:  Timothy Patterson presents after psychiatric hospitalization for med management.  He has a history of psychosis and severe cannabis use disorder.  He is approximately 1 month out from psychiatric hospitalization, and reports that his psychotic symptoms have been kept at bay.  He has been off of his medications for about a week.  He is predominantly interested  in treating depression and inattentive symptoms.  I reviewed the risks and benefits of Wellbutrin with him, including the risk of eliciting psychotic symptoms and inducing paranoia.  He declines antipsychotic at this time.  We will proceed as below and follow-up in 8 weeks as scheduled.  1. Psychosis, unspecified psychosis type (HCC)   2. Cannabis use disorder, moderate, dependence (HCC)   3. Alcohol use disorder, mild, abuse   4. Moderate episode of recurrent major depressive disorder (HCC)    Status of current problems: gradually worsening  Labs Ordered: No orders of the defined types were placed in this encounter.   Labs Reviewed: Reviewed recent psychiatric hospitalization labs, elevated prolactin level, UDS positive for cannabis  Collateral Obtained/Records Reviewed: [facility]  Plan:  Initiate Wellbutrin 150 mg XL daily.  Patient has no history of seizures, and I reviewed the risks of increasing anxiety and paranoia, and instructed him to discontinue immediately if this should occur Continue trazodone 100-200 mg nightly Patient declines to restart Abilify or consider other atypical antipsychotic at this time I spent ample time educating the patient on  the severe detrimental effects of cannabis, particularly given his presentation of psychosis, and I recommended he establish individual therapy for cannabis use disorder  I spent 25 minutes with the patient in direct face-to-face clinical care.  Greater than 50% of this time was spent in counseling and coordination of care with the patient.    Burnard LeighAlexander Arya Eksir, MD 07/11/2017, 1:03 PM

## 2017-07-15 ENCOUNTER — Ambulatory Visit: Payer: BC Managed Care – PPO | Admitting: Emergency Medicine

## 2017-07-16 ENCOUNTER — Ambulatory Visit: Payer: BC Managed Care – PPO | Admitting: Emergency Medicine

## 2017-07-16 ENCOUNTER — Other Ambulatory Visit: Payer: Self-pay

## 2017-07-16 ENCOUNTER — Encounter: Payer: Self-pay | Admitting: Emergency Medicine

## 2017-07-16 VITALS — BP 100/60 | HR 85 | Temp 99.0°F | Resp 16 | Ht 68.0 in | Wt 154.2 lb

## 2017-07-16 DIAGNOSIS — J029 Acute pharyngitis, unspecified: Secondary | ICD-10-CM

## 2017-07-16 LAB — POCT RAPID STREP A (OFFICE): RAPID STREP A SCREEN: NEGATIVE

## 2017-07-16 MED ORDER — AZITHROMYCIN 250 MG PO TABS: ORAL_TABLET | ORAL | 0 refills | Status: DC

## 2017-07-16 NOTE — Progress Notes (Signed)
Timothy Patterson 21 y.o.   Chief Complaint  Patient presents with  . Sore Throat    with white spots on back throat x 3-4 days    HISTORY OF PRESENT ILLNESS: This is a 21 y.o. male complaining of sore throat x 3-4 days.  Sore Throat   This is a new problem. The current episode started in the past 7 days. The problem has been gradually worsening. There has been no fever. The pain is moderate. Associated symptoms include swollen glands. Pertinent negatives include no abdominal pain, congestion, coughing, diarrhea, ear pain, headaches, neck pain, shortness of breath, trouble swallowing or vomiting. He has had no exposure to strep or mono. He has tried nothing for the symptoms.     Prior to Admission medications   Medication Sig Start Date End Date Taking? Authorizing Provider  buPROPion (WELLBUTRIN XL) 150 MG 24 hr tablet Take 1 tablet (150 mg total) every morning by mouth. 07/11/17 07/11/18 Yes Eksir, Bo McclintockAlexander Arya, MD  traZODone (DESYREL) 100 MG tablet Take 1-2 tablets at bedtime for sleep 07/11/17  Yes Eksir, Bo McclintockAlexander Arya, MD    No Known Allergies  Patient Active Problem List   Diagnosis Date Noted  . Psychosis (HCC) 06/05/2017  . Alcohol use disorder, mild, abuse 04/22/2017  . Cannabis use disorder, moderate, dependence (HCC) 04/22/2017  . Immunity status testing 04/16/2017  . Need for meningitis vaccination 04/16/2017    Past Medical History:  Diagnosis Date  . ADHD (attention deficit hyperactivity disorder)    HX of ADHD  . Anxiety     Past Surgical History:  Procedure Laterality Date  . ingrown toe nail      Social History   Socioeconomic History  . Marital status: Single    Spouse name: Not on file  . Number of children: Not on file  . Years of education: Not on file  . Highest education level: Not on file  Social Needs  . Financial resource strain: Not on file  . Food insecurity - worry: Not on file  . Food insecurity - inability: Not on file  .  Transportation needs - medical: Not on file  . Transportation needs - non-medical: Not on file  Occupational History  . Not on file  Tobacco Use  . Smoking status: Current Some Day Smoker    Types: Cigarettes  . Smokeless tobacco: Never Used  . Tobacco comment: not often  Substance and Sexual Activity  . Alcohol use: Yes    Alcohol/week: 0.0 oz    Comment: occasional  . Drug use: Yes    Frequency: 1.0 times per week    Types: Marijuana  . Sexual activity: No  Other Topics Concern  . Not on file  Social History Narrative  . Not on file    Family History  Problem Relation Age of Onset  . Depression Mother   . Anxiety disorder Mother   . Bipolar disorder Mother   . Depression Sister   . Anxiety disorder Sister   . Hyperlipidemia Paternal Grandfather   . Bipolar disorder Maternal Grandmother   . Depression Maternal Grandmother      Review of Systems  Constitutional: Negative.  Negative for chills and fever.  HENT: Positive for sore throat. Negative for congestion, ear pain and trouble swallowing.   Eyes: Negative.  Negative for discharge and redness.  Respiratory: Negative.  Negative for cough and shortness of breath.   Cardiovascular: Negative.  Negative for chest pain and palpitations.  Gastrointestinal: Negative.  Negative for abdominal pain, diarrhea, nausea and vomiting.  Genitourinary: Negative for dysuria and hematuria.  Musculoskeletal: Negative.  Negative for joint pain and neck pain.  Skin: Negative for rash.  Neurological: Negative.  Negative for headaches.  Endo/Heme/Allergies: Negative.   All other systems reviewed and are negative.  Vitals:   07/16/17 1724  BP: 100/60  Pulse: 85  Resp: 16  Temp: 99 F (37.2 C)  SpO2: 100%     Physical Exam  Constitutional: He is oriented to person, place, and time. He appears well-developed and well-nourished.  Non-toxic appearance.  HENT:  Head: Normocephalic and atraumatic.  Right Ear: Tympanic membrane and  ear canal normal.  Left Ear: Tympanic membrane and ear canal normal.  Mouth/Throat: Mucous membranes are normal. No uvula swelling. Oropharyngeal exudate and posterior oropharyngeal erythema present. No tonsillar abscesses.  Eyes: EOM are normal. Pupils are equal, round, and reactive to light.  Neck: Normal range of motion. Neck supple.  Cardiovascular: Normal rate, regular rhythm and normal heart sounds.  Pulmonary/Chest: Effort normal and breath sounds normal.  Abdominal: Soft. There is no tenderness.  Lymphadenopathy:    He has cervical adenopathy.  Neurological: He is alert and oriented to person, place, and time.  Skin: Skin is warm and dry. Capillary refill takes less than 2 seconds. No rash noted.  Psychiatric: He has a normal mood and affect. His behavior is normal.  Vitals reviewed.   Results for orders placed or performed in visit on 07/16/17 (from the past 24 hour(s))  POCT rapid strep A     Status: None   Collection Time: 07/16/17  5:41 PM  Result Value Ref Range   Rapid Strep A Screen Negative Negative    ASSESSMENT & PLAN: Bertin was seen today for sore throat.  Diagnoses and all orders for this visit:  Acute pharyngitis, unspecified etiology -     Culture, Group A Strep -     POCT rapid strep A  Sore throat  Other orders -     azithromycin (ZITHROMAX) 250 MG tablet; Sig as indicated     Patient Instructions       IF you received an x-ray today, you will receive an invoice from [facility]. Please contact [facility] at [phone] with questions or concerns regarding your invoice.   IF you received labwork today, you will receive an invoice from [facility]. Please contact [facility] at [phone] with questions or concerns regarding your invoice.   Our billing staff will not be able to assist you with questions regarding bills from these companies.  You will be contacted with the lab results as soon as they are available. The  fastest way to get your results is to activate your My Chart account. Instructions are located on the last page of this paperwork. If you have not heard from [place] regarding the results in 2 weeks, please contact this office.     Pharyngitis Pharyngitis is a sore throat (pharynx). There is redness, pain, and swelling of your throat. Follow these instructions at home:  Drink enough fluids to keep your pee (urine) clear or pale yellow.  Only take medicine as told by your doctor. ? You may get sick again if you do not take medicine as told. Finish your medicines, even if you start to feel better. ? Do not take aspirin.  Rest.  Rinse your mouth (gargle) with salt water ( tsp of salt per 1 qt of water) every 1-2 hours. This will help the pain.  If you are not at risk for choking, you can suck on hard candy or sore throat lozenges. Contact a doctor if:  You have large, tender lumps on your neck.  You have a rash.  You cough up green, yellow-brown, or bloody spit. Get help right away if:  You have a stiff neck.  You drool or cannot swallow liquids.  You throw up (vomit) or are not able to keep medicine or liquids down.  You have very bad pain that does not go away with medicine.  You have problems breathing (not from a stuffy nose). This information is not intended to replace advice given to you by your health care provider. Make sure you discuss any questions you have with your health care provider. Document Released: 02/05/2008 Document Revised: 01/25/2016 Document Reviewed: 04/26/2013 Elsevier Interactive Patient Education  2017 Elsevier Inc.     Edwina BarthMiguel Georgeana Oertel, MD [facility]

## 2017-07-16 NOTE — Patient Instructions (Addendum)
     IF you received an x-ray today, you will receive an invoice from [facility]. Please contact [facility] at [phone] with questions or concerns regarding your invoice.   IF you received labwork today, you will receive an invoice from [facility]. Please contact [facility] at [phone] with questions or concerns regarding your invoice.   Our billing staff will not be able to assist you with questions regarding bills from these companies.  You will be contacted with the lab results as soon as they are available. The fastest way to get your results is to activate your My Chart account. Instructions are located on the last page of this paperwork. If you have not heard from us regarding the results in 2 weeks, please contact this office.     Pharyngitis Pharyngitis is a sore throat (pharynx). There is redness, pain, and swelling of your throat. Follow these instructions at home:  Drink enough fluids to keep your pee (urine) clear or pale yellow.  Only take medicine as told by your doctor.  You may get sick again if you do not take medicine as told. Finish your medicines, even if you start to feel better.  Do not take aspirin.  Rest.  Rinse your mouth (gargle) with salt water ( tsp of salt per 1 qt of water) every 1-2 hours. This will help the pain.  If you are not at risk for choking, you can suck on hard candy or sore throat lozenges. Contact a doctor if:  You have large, tender lumps on your neck.  You have a rash.  You cough up green, yellow-brown, or bloody spit. Get help right away if:  You have a stiff neck.  You drool or cannot swallow liquids.  You throw up (vomit) or are not able to keep medicine or liquids down.  You have very bad pain that does not go away with medicine.  You have problems breathing (not from a stuffy nose). This information is not intended to replace advice given to you by your health care provider. Make sure you  discuss any questions you have with your health care provider. Document Released: 02/05/2008 Document Revised: 01/25/2016 Document Reviewed: 04/26/2013 Elsevier Interactive Patient Education  2017 Elsevier Inc.  

## 2017-07-18 LAB — CULTURE, GROUP A STREP

## 2017-07-21 ENCOUNTER — Encounter: Payer: Self-pay | Admitting: *Deleted

## 2017-07-22 ENCOUNTER — Encounter: Payer: Self-pay | Admitting: Emergency Medicine

## 2017-07-22 ENCOUNTER — Ambulatory Visit: Payer: BC Managed Care – PPO | Admitting: Emergency Medicine

## 2017-07-22 VITALS — BP 122/72 | HR 94 | Temp 98.2°F | Resp 17 | Ht 68.0 in | Wt 152.0 lb

## 2017-07-22 DIAGNOSIS — J029 Acute pharyngitis, unspecified: Secondary | ICD-10-CM

## 2017-07-22 DIAGNOSIS — B349 Viral infection, unspecified: Secondary | ICD-10-CM | POA: Diagnosis not present

## 2017-07-22 DIAGNOSIS — R21 Rash and other nonspecific skin eruption: Secondary | ICD-10-CM

## 2017-07-22 NOTE — Progress Notes (Signed)
Timothy Patterson [age] y.oEstil Patterson.   Chief Complaint  Patient presents with  . Sore Throat  . URI  . Rash    HISTORY OF PRESENT ILLNESS: This is a 21 y.o. male seen by me 11/14 with strep throat; took Z-pak and got better; yesterday started getting some of the symptoms back with "cottony mouth" and also a rash; no other new symptoms; not sexually active; no penile discharge or genital ulcers.    HPI   Prior to Admission medications   Medication Sig Start Date End Date Taking? Authorizing Provider  buPROPion (WELLBUTRIN XL) 150 MG 24 hr tablet Take 1 tablet (150 mg total) every morning by mouth. 07/11/17 07/11/18 Yes Eksir, Bo McclintockAlexander Arya, MD  traZODone (DESYREL) 100 MG tablet Take 1-2 tablets at bedtime for sleep 07/11/17  Yes Eksir, Bo McclintockAlexander Arya, MD    No Known Allergies  Patient Active Problem List   Diagnosis Date Noted  . Acute pharyngitis 07/16/2017  . Sore throat 07/16/2017  . Psychosis (HCC) 06/05/2017  . Alcohol use disorder, mild, abuse 04/22/2017  . Cannabis use disorder, moderate, dependence (HCC) 04/22/2017  . Immunity status testing 04/16/2017  . Need for meningitis vaccination 04/16/2017    Past Medical History:  Diagnosis Date  . ADHD (attention deficit hyperactivity disorder)    HX of ADHD  . Anxiety     Past Surgical History:  Procedure Laterality Date  . ingrown toe nail      Social History   Socioeconomic History  . Marital status: Single    Spouse name: Not on file  . Number of children: Not on file  . Years of education: Not on file  . Highest education level: Not on file  Social Needs  . Financial resource strain: Not on file  . Food insecurity - worry: Not on file  . Food insecurity - inability: Not on file  . Transportation needs - medical: Not on file  . Transportation needs - non-medical: Not on file  Occupational History  . Not on file  Tobacco Use  . Smoking status: Current Some Day Smoker    Types: Cigarettes  . Smokeless  tobacco: Never Used  . Tobacco comment: not often  Substance and Sexual Activity  . Alcohol use: Yes    Alcohol/week: 0.0 oz    Comment: occasional  . Drug use: Yes    Frequency: 1.0 times per week    Types: Marijuana  . Sexual activity: No  Other Topics Concern  . Not on file  Social History Narrative  . Not on file    Family History  Problem Relation Age of Onset  . Depression Mother   . Anxiety disorder Mother   . Bipolar disorder Mother   . Depression Sister   . Anxiety disorder Sister   . Hyperlipidemia Paternal Grandfather   . Bipolar disorder Maternal Grandmother   . Depression Maternal Grandmother      Review of Systems  Constitutional: Negative.  Negative for chills, fever, malaise/fatigue and weight loss.  HENT: Positive for sore throat.   Eyes: Negative for discharge and redness.  Respiratory: Negative for cough and shortness of breath.   Cardiovascular: Negative for chest pain and palpitations.  Gastrointestinal: Negative for abdominal pain, diarrhea, nausea and vomiting.  Genitourinary: Negative for dysuria and hematuria.  Musculoskeletal: Negative for back pain, myalgias and neck pain.  Skin: Positive for rash.  Neurological: Negative for dizziness and headaches.  Endo/Heme/Allergies: Negative.   All other systems reviewed and are negative.  Vitals:   07/22/17 1115  BP: 122/72  Pulse: 94  Resp: 17  Temp: 98.2 F (36.8 C)  SpO2: 98%     Physical Exam  Constitutional: He is oriented to person, place, and time. He appears well-developed and well-nourished.  HENT:  Head: Normocephalic and atraumatic.  Nose: Nose normal.  Mouth/Throat: Uvula is midline. No uvula swelling. Posterior oropharyngeal erythema present. No oropharyngeal exudate or tonsillar abscesses.  Eyes: Conjunctivae and EOM are normal. Pupils are equal, round, and reactive to light.  Neck: Normal range of motion. Neck supple. No JVD present.  Cardiovascular: Normal rate, regular  rhythm and normal heart sounds.  Pulmonary/Chest: Effort normal and breath sounds normal.  Abdominal: Soft. Bowel sounds are normal. There is no tenderness.  Musculoskeletal: Normal range of motion.  Lymphadenopathy:    He has no cervical adenopathy.  Neurological: He is alert and oriented to person, place, and time. No sensory deficit. He exhibits normal muscle tone.  Skin: Capillary refill takes less than 2 seconds. Rash noted.  +palmar rash and also in upper and lower extremities; diffuse rash, maculo-papular.  Psychiatric: He has a normal mood and affect. His behavior is normal.  Vitals reviewed.    ASSESSMENT & PLAN: Timothy Patterson was seen today for sore throat, uri and rash.  Diagnoses and all orders for this visit:  Sore throat -     Mononucleosis Test, Qual W/ Reflex  Skin rash -     Mononucleosis Test, Qual W/ Reflex -     Rocky mtn spotted fvr ab, IgM-blood  Viral illness -     CBC with Differential/Platelet -     Comprehensive metabolic panel    Patient Instructions       IF you received an x-ray today, you will receive an invoice [facility]. Please contact [facility] at [phone] with questions or concerns regarding your invoice.   IF you received labwork today, you will receive an invoice from [facility]. Please contact [facility] at [phone] with questions or concerns regarding your invoice.   Our billing staff will not be able to assist you with questions regarding bills from these companies.  You will be contacted with the lab results as soon as they are available. The fastest way to get your results is to activate your My Chart account. Instructions are located on the last page of this paperwork. If you have not heard from us regarding the results in 2 weeks, please contact this office.      Viral Illness, Adult Viruses are tiny germs that can get into a person's body and cause illness. There are many different types of  viruses, and they cause many types of illness. Viral illnesses can range from mild to severe. They can affect various parts of the body. Common illnesses that are caused by a virus include colds and the flu. Viral illnesses also include serious conditions such as HIV/AIDS (human immunodeficiency virus/acquired immunodeficiency syndrome). A few viruses have been linked to certain cancers. What are the causes? Many types of viruses can cause illness. Viruses invade cells in your body, multiply, and cause the infected cells to malfunction or die. When the cell dies, it releases more of the virus. When this happens, you develop symptoms of the illness, and the virus continues to spread to other cells. If the virus takes over the function of the cell, it can cause the cell to divide and grow out of control, as is the case when a virus causes cancer. Different  viruses get into the body in different ways. You can get a virus by:  Swallowing food or water that is contaminated with the virus.  Breathing in droplets that have been coughed or sneezed into the air by an infected person.  Touching a surface that has been contaminated with the virus and then touching your eyes, nose, or mouth.  Being bitten by an insect or animal that carries the virus.  Having sexual contact with a person who is infected with the virus.  Being exposed to blood or fluids that contain the virus, either through an open cut or during a transfusion.  If a virus enters your body, your body's defense system (immune system) will try to fight the virus. You may be at higher risk for a viral illness if your immune system is weak. What are the signs or symptoms? Symptoms vary depending on the type of virus and the location of the cells that it invades. Common symptoms of the main types of viral illnesses include: Cold and flu viruses  Fever.  Headache.  Sore throat.  Muscle aches.  Nasal congestion.  Cough. Digestive  system (gastrointestinal) viruses  Fever.  Abdominal pain.  Nausea.  Diarrhea. Liver viruses (hepatitis)  Loss of appetite.  Tiredness.  Yellowing of the skin (jaundice). Brain and spinal cord viruses  Fever.  Headache.  Stiff neck.  Nausea and vomiting.  Confusion or sleepiness. Skin viruses  Warts.  Itching.  Rash. Sexually transmitted viruses  Discharge.  Swelling.  Redness.  Rash. How is this treated? Viruses can be difficult to treat because they live within cells. Antibiotic medicines do not treat viruses because these drugs do not get inside cells. Treatment for a viral illness may include:  Resting and drinking plenty of fluids.  Medicines to relieve symptoms. These can include over-the-counter medicine for pain and fever, medicines for cough or congestion, and medicines to relieve diarrhea.  Antiviral medicines. These drugs are available only for certain types of viruses. They may help reduce flu symptoms if taken early. There are also many antiviral medicines for hepatitis and HIV/AIDS.  Some viral illnesses can be prevented with vaccinations. A common example is the flu shot. Follow these instructions at home: Medicines   Take over-the-counter and prescription medicines only as told by your health care provider.  If you were prescribed an antiviral medicine, take it as told by your health care provider. Do not stop taking the medicine even if you start to feel better.  Be aware of when antibiotics are needed and when they are not needed. Antibiotics do not treat viruses. If your health care provider thinks that you may have a bacterial infection as well as a viral infection, you may get an antibiotic. ? Do not ask for an antibiotic prescription if you have been diagnosed with a viral illness. That will not make your illness go away faster. ? Frequently taking antibiotics when they are not needed can lead to antibiotic resistance. When this  develops, the medicine no longer works against the bacteria that it normally fights. General instructions  Drink enough fluids to keep your urine clear or pale yellow.  Rest as much as possible.  Return to your normal activities as told by your health care provider. Ask your health care provider what activities are safe for you.  Keep all follow-up visits as told by your health care provider. This is important. How is this prevented? Take these actions to reduce your risk of viral infection:  Eat a healthy diet and get enough rest.  Wash your hands often with soap and water. This is especially important when you are in public places. If soap and water are not available, use hand sanitizer.  Avoid close contact with friends and family who have a viral illness.  If you travel to areas where viral gastrointestinal infection is common, avoid drinking water or eating raw food.  Keep your immunizations up to date. Get a flu shot every year as told by your health care provider.  Do not share toothbrushes, nail clippers, razors, or needles with other people.  Always practice safe sex.  Contact a health care provider if:  You have symptoms of a viral illness that do not go away.  Your symptoms come back after going away.  Your symptoms get worse. Get help right away if:  You have trouble breathing.  You have a severe headache or a stiff neck.  You have severe vomiting or abdominal pain. This information is not intended to replace advice given to you by your health care provider. Make sure you discuss any questions you have with your health care provider. Document Released: 12/29/2015 Document Revised: 01/31/2016 Document Reviewed: 12/29/2015 Elsevier Interactive Patient Education  2018 [employer]      Edwina Barth, MD [facility]

## 2017-07-22 NOTE — Patient Instructions (Addendum)
   IF you received an x-ray today, you will receive an invoice from [facility]. Please contact [facility] at [phone] with questions or concerns regarding your invoice.   IF you received labwork today, you will receive an invoice from [facility]. Please contact [facility] at [phone] with questions or concerns regarding your invoice.   Our billing staff will not be able to assist you with questions regarding bills from these companies.  You will be contacted with the lab results as soon as they are available. The fastest way to get your results is to activate your My Chart account. Instructions are located on the last page of this paperwork. If you have not heard from us regarding the results in 2 weeks, please contact this office.     Viral Illness, Adult Viruses are tiny germs that can get into a person's body and cause illness. There are many different types of viruses, and they cause many types of illness. Viral illnesses can range from mild to severe. They can affect various parts of the body. Common illnesses that are caused by a virus include colds and the flu. Viral illnesses also include serious conditions such as HIV/AIDS (human immunodeficiency virus/acquired immunodeficiency syndrome). A few viruses have been linked to certain cancers. What are the causes? Many types of viruses can cause illness. Viruses invade cells in your body, multiply, and cause the infected cells to malfunction or die. When the cell dies, it releases more of the virus. When this happens, you develop symptoms of the illness, and the virus continues to spread to other cells. If the virus takes over the function of the cell, it can cause the cell to divide and grow out of control, as is the case when a virus causes cancer. Different viruses get into the body in different ways. You can get a virus by:  Swallowing food or water that is contaminated with the virus.  Breathing in droplets  that have been coughed or sneezed into the air by an infected person.  Touching a surface that has been contaminated with the virus and then touching your eyes, nose, or mouth.  Being bitten by an insect or animal that carries the virus.  Having sexual contact with a person who is infected with the virus.  Being exposed to blood or fluids that contain the virus, either through an open cut or during a transfusion.  If a virus enters your body, your body's defense system (immune system) will try to fight the virus. You may be at higher risk for a viral illness if your immune system is weak. What are the signs or symptoms? Symptoms vary depending on the type of virus and the location of the cells that it invades. Common symptoms of the main types of viral illnesses include: Cold and flu viruses  Fever.  Headache.  Sore throat.  Muscle aches.  Nasal congestion.  Cough. Digestive system (gastrointestinal) viruses  Fever.  Abdominal pain.  Nausea.  Diarrhea. Liver viruses (hepatitis)  Loss of appetite.  Tiredness.  Yellowing of the skin (jaundice). Brain and spinal cord viruses  Fever.  Headache.  Stiff neck.  Nausea and vomiting.  Confusion or sleepiness. Skin viruses  Warts.  Itching.  Rash. Sexually transmitted viruses  Discharge.  Swelling.  Redness.  Rash. How is this treated? Viruses can be difficult to treat because they live within cells. Antibiotic medicines do not treat viruses because these drugs do not get inside cells. Treatment for a viral illness   may include:  Resting and drinking plenty of fluids.  Medicines to relieve symptoms. These can include over-the-counter medicine for pain and fever, medicines for cough or congestion, and medicines to relieve diarrhea.  Antiviral medicines. These drugs are available only for certain types of viruses. They may help reduce flu symptoms if taken early. There are also many antiviral medicines  for hepatitis and HIV/AIDS.  Some viral illnesses can be prevented with vaccinations. A common example is the flu shot. Follow these instructions at home: Medicines   Take over-the-counter and prescription medicines only as told by your health care provider.  If you were prescribed an antiviral medicine, take it as told by your health care provider. Do not stop taking the medicine even if you start to feel better.  Be aware of when antibiotics are needed and when they are not needed. Antibiotics do not treat viruses. If your health care provider thinks that you may have a bacterial infection as well as a viral infection, you may get an antibiotic. ? Do not ask for an antibiotic prescription if you have been diagnosed with a viral illness. That will not make your illness go away faster. ? Frequently taking antibiotics when they are not needed can lead to antibiotic resistance. When this develops, the medicine no longer works against the bacteria that it normally fights. General instructions  Drink enough fluids to keep your urine clear or pale yellow.  Rest as much as possible.  Return to your normal activities as told by your health care provider. Ask your health care provider what activities are safe for you.  Keep all follow-up visits as told by your health care provider. This is important. How is this prevented? Take these actions to reduce your risk of viral infection:  Eat a healthy diet and get enough rest.  Wash your hands often with soap and water. This is especially important when you are in public places. If soap and water are not available, use hand sanitizer.  Avoid close contact with friends and family who have a viral illness.  If you travel to areas where viral gastrointestinal infection is common, avoid drinking water or eating raw food.  Keep your immunizations up to date. Get a flu shot every year as told by your health care provider.  Do not share toothbrushes,  nail clippers, razors, or needles with other people.  Always practice safe sex.  Contact a health care provider if:  You have symptoms of a viral illness that do not go away.  Your symptoms come back after going away.  Your symptoms get worse. Get help right away if:  You have trouble breathing.  You have a severe headache or a stiff neck.  You have severe vomiting or abdominal pain. This information is not intended to replace advice given to you by your health care provider. Make sure you discuss any questions you have with your health care provider. Document Released: 12/29/2015 Document Revised: 01/31/2016 Document Reviewed: 12/29/2015 Elsevier Interactive Patient Education  2018 [employer]  

## 2017-07-23 ENCOUNTER — Encounter: Payer: Self-pay | Admitting: Emergency Medicine

## 2017-07-23 ENCOUNTER — Encounter: Payer: Self-pay | Admitting: Radiology

## 2017-07-24 LAB — COMPREHENSIVE METABOLIC PANEL
ALK PHOS: 70 IU/L (ref 39–117)
ALT: 58 IU/L — AB (ref 0–44)
AST: 34 IU/L (ref 0–40)
Albumin/Globulin Ratio: 1.2 (ref 1.2–2.2)
Albumin: 4.3 g/dL (ref 3.5–5.5)
BILIRUBIN TOTAL: 0.8 mg/dL (ref 0.0–1.2)
BUN/Creatinine Ratio: 13 (ref 9–20)
BUN: 11 mg/dL (ref 6–20)
CHLORIDE: 102 mmol/L (ref 96–106)
CO2: 24 mmol/L (ref 20–29)
Calcium: 9.8 mg/dL (ref 8.7–10.2)
Creatinine, Ser: 0.85 mg/dL (ref 0.76–1.27)
GFR calc Af Amer: 146 mL/min/{1.73_m2} (ref >59)
GFR calc non Af Amer: 126 mL/min/{1.73_m2} (ref >59)
GLUCOSE: 82 mg/dL (ref 65–99)
Globulin, Total: 3.5 g/dL (ref 1.5–4.5)
Potassium: 4.2 mmol/L (ref 3.5–5.2)
Sodium: 141 mmol/L (ref 134–144)
Total Protein: 7.8 g/dL (ref 6.0–8.5)

## 2017-07-24 LAB — CBC WITH DIFFERENTIAL/PLATELET
BASOS ABS: 0.1 10*3/uL (ref 0.0–0.2)
Basos: 1 %
EOS (ABSOLUTE): 0.3 10*3/uL (ref 0.0–0.4)
Eos: 6 %
Hematocrit: 42.3 % (ref 37.5–51.0)
Hemoglobin: 15.1 g/dL (ref 13.0–17.7)
Immature Grans (Abs): 0 10*3/uL (ref 0.0–0.1)
Immature Granulocytes: 1 %
LYMPHS ABS: 2.6 10*3/uL (ref 0.7–3.1)
LYMPHS: 47 %
MCH: 30.7 pg (ref 26.6–33.0)
MCHC: 35.7 g/dL (ref 31.5–35.7)
MCV: 86 fL (ref 79–97)
Monocytes Absolute: 0.5 10*3/uL (ref 0.1–0.9)
Monocytes: 9 %
NEUTROS ABS: 2 10*3/uL (ref 1.4–7.0)
Neutrophils: 36 %
Platelets: 285 10*3/uL (ref 150–379)
RBC: 4.92 x10E6/uL (ref 4.14–5.80)
RDW: 13.5 % (ref 12.3–15.4)
WBC: 5.5 10*3/uL (ref 3.4–10.8)

## 2017-07-24 LAB — MONONUCLEOSIS TEST, QUANT: MONO TITER: 1:64 {titer} — ABNORMAL HIGH

## 2017-07-24 LAB — ROCKY MTN SPOTTED FVR AB, IGM-BLOOD: RMSF IGM: 1.43 {index} — AB (ref 0.00–0.89)

## 2017-07-24 LAB — MONO QUAL W/RFLX QN: MONO QUAL W/RFLX QN: POSITIVE — AB

## 2017-07-25 ENCOUNTER — Encounter: Payer: Self-pay | Admitting: Emergency Medicine

## 2017-07-25 ENCOUNTER — Other Ambulatory Visit: Payer: Self-pay | Admitting: Emergency Medicine

## 2017-07-25 MED ORDER — DOXYCYCLINE HYCLATE 100 MG PO TABS: 100.0000 mg | ORAL_TABLET | Freq: Two times a day (BID) | ORAL | 1 refills | Status: AC

## 2017-09-17 ENCOUNTER — Ambulatory Visit (HOSPITAL_COMMUNITY): Payer: Self-pay | Admitting: Psychiatry

## 2017-10-07 ENCOUNTER — Ambulatory Visit: Payer: BC Managed Care – PPO | Admitting: Family Medicine

## 2017-10-07 ENCOUNTER — Other Ambulatory Visit: Payer: Self-pay

## 2017-10-07 ENCOUNTER — Encounter: Payer: Self-pay | Admitting: Family Medicine

## 2017-10-07 VITALS — BP 98/64 | HR 97 | Temp 98.4°F | Ht 68.0 in | Wt 160.0 lb

## 2017-10-07 DIAGNOSIS — F29 Unspecified psychosis not due to a substance or known physiological condition: Secondary | ICD-10-CM | POA: Diagnosis not present

## 2017-10-07 DIAGNOSIS — R7989 Other specified abnormal findings of blood chemistry: Secondary | ICD-10-CM

## 2017-10-07 DIAGNOSIS — E559 Vitamin D deficiency, unspecified: Secondary | ICD-10-CM

## 2017-10-07 DIAGNOSIS — F331 Major depressive disorder, recurrent, moderate: Secondary | ICD-10-CM | POA: Diagnosis not present

## 2017-10-07 DIAGNOSIS — R5383 Other fatigue: Secondary | ICD-10-CM

## 2017-10-07 DIAGNOSIS — R3911 Hesitancy of micturition: Secondary | ICD-10-CM

## 2017-10-07 DIAGNOSIS — Z113 Encounter for screening for infections with a predominantly sexual mode of transmission: Secondary | ICD-10-CM

## 2017-10-07 MED ORDER — ARIPIPRAZOLE 2 MG PO TABS: 2.0000 mg | ORAL_TABLET | Freq: Every day | ORAL | 1 refills | Status: DC

## 2017-10-07 MED ORDER — BUPROPION HCL ER (XL) 150 MG PO TB24: 150.0000 mg | ORAL_TABLET | ORAL | 1 refills | Status: DC

## 2017-10-07 NOTE — Progress Notes (Signed)
2/5/20196:12 PM  Timothy Patterson 1996/06/27, 22 y.o. male [id number]  Chief Complaint  Patient presents with  . Fatigue    FEELS FATIGUE DUE TO LACK OF FOOD, FEELS LIKE HE HAS AN INFECTION OF SOME SORT  . Depression    NEEDS A MED STHAT DOES NOT GIVE HIM BAD THOUGHTS/MOOD    HPI:   Patient is a 22 y.o. male with past medical history significant for depression with psychosis with most recent hospitalization in 07/2018 who presents today for concerns of psychiatric medications. He has stopped all meds of recent. Lexapro, caused increased SI thoughts. He states that he also stopped trazodone because he has been actually oversleeping as he feels tired all the time. States that wellbutrin was helping his mood but made his psychosis worse. He states that in the past he used to be on antipsychotics, abilify 10mg , seroquel 100mg , and found them to be too sedating. He has never tried lower doses.   He reports being able to cope with current hallucinations, he tends to see fleeting shadows and hear mumbling voices speaking in the background. They dont tend to talk directly to him and when they do its random conversation, denies them being denigrating or commanding. He denies paranoia or mania.  He denies any current SI/HI.   Saw outpatient psychiatrist once after his hospitalization, unable to go back due to copay.   He is worried that his fatigue might be related to poor nutrition. He is living in the basement with his father, there are several other young adults in the household, nobody is working except his father, therefore money - food is tight.   He is also requesting STD testing, denies any h/o STD, denies any known exposures. Denies any GU symptoms.  Depression screen [facility] 2/9 10/07/2017 07/22/2017 07/16/2017  Decreased Interest 1 0 0  Down, Depressed, Hopeless 1 0 0  PHQ - 2 Score 2 0 0  Altered sleeping 1 - -  Tired, decreased energy 3 - -  Change in appetite 3 - -  Feeling  bad or failure about yourself  0 - -  Trouble concentrating 1 - -  Moving slowly or fidgety/restless 2 - -  Suicidal thoughts 0 - -  PHQ-9 Score 12 - -  Difficult doing work/chores Not difficult at all - -    No Known Allergies  Prior to Admission medications   Medication Sig Start Date End Date Taking? Authorizing Provider    Past Medical History:  Diagnosis Date  . ADHD (attention deficit hyperactivity disorder)    HX of ADHD  . Anxiety   . Severe recurrent depression with psychosis [facility])    hospitalized 07/2017    Past Surgical History:  Procedure Laterality Date  . ingrown toe nail      Social History   Tobacco Use  . Smoking status: Current Some Day Smoker    Types: Cigarettes  . Smokeless tobacco: Never Used  . Tobacco comment: not often  Substance Use Topics  . Alcohol use: Yes    Alcohol/week: 0.0 oz    Comment: occasional    Family History  Problem Relation Age of Onset  . Depression Mother   . Anxiety disorder Mother   . Bipolar disorder Mother   . Depression Sister   . Anxiety disorder Sister   . Hyperlipidemia Paternal Grandfather   . Bipolar disorder Maternal Grandmother   . Depression Maternal Grandmother     Review of Systems  Constitutional: Negative for chills  and fever.  Respiratory: Negative for cough and shortness of breath.   Cardiovascular: Negative for chest pain, palpitations and leg swelling.  Gastrointestinal: Negative for abdominal pain, nausea and vomiting.  Neurological: Negative for dizziness.  Psychiatric/Behavioral: Positive for depression and hallucinations. Negative for suicidal ideas. The patient is nervous/anxious. The patient does not have insomnia.     OBJECTIVE:  Blood pressure 98/64, pulse 97, temperature 98.4 F (36.9 C), temperature source Oral, height 5\' 8"  (1.727 m), weight 160 lb (72.6 kg), SpO2 100 %.  Physical Exam  Constitutional: He is oriented to person, place, and time and well-developed,  well-nourished, and in no distress.  HENT:  Head: Normocephalic and atraumatic.  Right Ear: Hearing, tympanic membrane, external ear and ear canal normal.  Left Ear: Hearing, tympanic membrane, external ear and ear canal normal.  Mouth/Throat: Oropharynx is clear and moist. No oropharyngeal exudate.  Eyes: Conjunctivae and EOM are normal. Pupils are equal, round, and reactive to light.  Neck: Neck supple. No thyromegaly present.  Cardiovascular: Normal rate, regular rhythm, normal heart sounds and intact distal pulses. Exam reveals no gallop and no friction rub.  No murmur heard. Pulmonary/Chest: Effort normal and breath sounds normal. He has no wheezes. He has no rales.  Abdominal: Soft. Bowel sounds are normal. He exhibits no distension and no mass. There is no tenderness.  Musculoskeletal: Normal range of motion. He exhibits no edema.  Lymphadenopathy:    He has no cervical adenopathy.  Neurological: He is alert and oriented to person, place, and time. He has normal reflexes. No cranial nerve deficit. Gait normal.  Skin: Skin is warm and dry.  Psychiatric: Mood and affect normal.     ASSESSMENT and PLAN  1. Moderate episode of recurrent major depressive disorder (HCC) Patient reports mood better on wellbutrin, however having a bit more psychosis, after discussing options, decided to restart wellbutrin and add a low dose of abilify to help manage hallucinations. We also discussed importance of returning to psychiatric care. Discussed that fatigue might be related to mood. Labs per below. Discussed community resources.  - buPROPion (WELLBUTRIN XL) 150 MG 24 hr tablet; Take 1 tablet (150 mg total) by mouth every morning. - TSH - Vitamin B12  2. Psychosis, unspecified psychosis type (HCC) - TSH - Vitamin B12  3. Fatigue, unspecified type - Ferritin - TSH - Vitamin B12 - CBC - Comprehensive metabolic panel - Vitamin D, 25-hydroxy  4. Screen for STD (sexually transmitted  disease) - HIV antibody - RPR - GC/Chlamydia Probe Amp([facility])  5. Urinary hesitancy - Urinalysis, dipstick only   Other orders - ARIPiprazole (ABILIFY) 2 MG tablet; Take 1 tablet (2 mg total) by mouth daily.   Return in about 2 weeks (around 10/21/2017).    Myles LippsIrma M Santiago, MD Primary Care at [facility] [address] [place], [place] [address] Ph.  [phone] Fax [phone]

## 2017-10-07 NOTE — Patient Instructions (Signed)
     IF you received an x-ray today, you will receive an invoice from [facility]. Please contact [facility] at [phone] with questions or concerns regarding your invoice.   IF you received labwork today, you will receive an invoice from [facility]. Please contact [facility] at [phone] with questions or concerns regarding your invoice.   Our billing staff will not be able to assist you with questions regarding bills from these companies.  You will be contacted with the lab results as soon as they are available. The fastest way to get your results is to activate your My Chart account. Instructions are located on the last page of this paperwork. If you have not heard from us regarding the results in 2 weeks, please contact this office.     

## 2017-10-08 LAB — URINALYSIS, DIPSTICK ONLY
Bilirubin, UA: NEGATIVE
Glucose, UA: NEGATIVE
Ketones, UA: NEGATIVE
Leukocytes, UA: NEGATIVE
Nitrite, UA: NEGATIVE
Protein, UA: NEGATIVE
RBC, UA: NEGATIVE
Specific Gravity, UA: 1.022 (ref 1.005–1.030)
Urobilinogen, Ur: 0.2 mg/dL (ref 0.2–1.0)
pH, UA: 6.5 (ref 5.0–7.5)

## 2017-10-08 LAB — HIV ANTIBODY (ROUTINE TESTING W REFLEX): HIV Screen 4th Generation wRfx: NONREACTIVE

## 2017-10-08 LAB — VITAMIN B12: Vitamin B-12: 456 pg/mL (ref 232–1245)

## 2017-10-08 LAB — COMPREHENSIVE METABOLIC PANEL
ALT: 25 IU/L (ref 0–44)
AST: 23 IU/L (ref 0–40)
Albumin/Globulin Ratio: 1.8 (ref 1.2–2.2)
Albumin: 5.2 g/dL (ref 3.5–5.5)
Alkaline Phosphatase: 56 IU/L (ref 39–117)
BUN/Creatinine Ratio: 11 (ref 9–20)
BUN: 11 mg/dL (ref 6–20)
Bilirubin Total: 1.4 mg/dL — ABNORMAL HIGH (ref 0.0–1.2)
CO2: 23 mmol/L (ref 20–29)
Calcium: 10.4 mg/dL — ABNORMAL HIGH (ref 8.7–10.2)
Chloride: 100 mmol/L (ref 96–106)
Creatinine, Ser: 0.98 mg/dL (ref 0.76–1.27)
GFR calc Af Amer: 129 mL/min/{1.73_m2} (ref >59)
GFR calc non Af Amer: 111 mL/min/{1.73_m2} (ref >59)
Globulin, Total: 2.9 g/dL (ref 1.5–4.5)
Glucose: 93 mg/dL (ref 65–99)
Potassium: 4.5 mmol/L (ref 3.5–5.2)
Sodium: 142 mmol/L (ref 134–144)
Total Protein: 8.1 g/dL (ref 6.0–8.5)

## 2017-10-08 LAB — RPR: RPR Ser Ql: NONREACTIVE

## 2017-10-08 LAB — CBC
Hematocrit: 44.6 % (ref 37.5–51.0)
Hemoglobin: 15.8 g/dL (ref 13.0–17.7)
MCH: 30.6 pg (ref 26.6–33.0)
MCHC: 35.4 g/dL (ref 31.5–35.7)
MCV: 86 fL (ref 79–97)
Platelets: 271 10*3/uL (ref 150–379)
RBC: 5.16 x10E6/uL (ref 4.14–5.80)
RDW: 14.7 % (ref 12.3–15.4)
WBC: 7 10*3/uL (ref 3.4–10.8)

## 2017-10-08 LAB — VITAMIN D 25 HYDROXY (VIT D DEFICIENCY, FRACTURES): Vit D, 25-Hydroxy: 12.5 ng/mL — ABNORMAL LOW (ref 30.0–100.0)

## 2017-10-08 LAB — TSH: TSH: 1.51 u[IU]/mL (ref 0.450–4.500)

## 2017-10-08 LAB — FERRITIN: Ferritin: 170 ng/mL — ABNORMAL HIGH (ref 16–124)

## 2017-10-09 LAB — GC/CHLAMYDIA PROBE AMP
Chlamydia trachomatis, NAA: NEGATIVE
Neisseria gonorrhoeae by PCR: NEGATIVE

## 2017-10-12 MED ORDER — VITAMIN D (ERGOCALCIFEROL) 1.25 MG (50000 UNIT) PO CAPS: 50000.0000 [IU] | ORAL_CAPSULE | ORAL | 0 refills | Status: DC

## 2017-10-15 ENCOUNTER — Encounter: Payer: Self-pay | Admitting: Family Medicine

## 2017-10-21 ENCOUNTER — Ambulatory Visit: Payer: BC Managed Care – PPO | Admitting: Family Medicine

## 2018-02-14 ENCOUNTER — Ambulatory Visit (INDEPENDENT_AMBULATORY_CARE_PROVIDER_SITE_OTHER): Payer: BC Managed Care – PPO

## 2018-02-14 ENCOUNTER — Ambulatory Visit: Payer: BC Managed Care – PPO | Admitting: Family Medicine

## 2018-02-14 ENCOUNTER — Encounter: Payer: Self-pay | Admitting: Family Medicine

## 2018-02-14 ENCOUNTER — Other Ambulatory Visit: Payer: Self-pay

## 2018-02-14 VITALS — BP 130/84 | HR 99 | Temp 97.8°F | Ht 68.7 in | Wt 173.6 lb

## 2018-02-14 DIAGNOSIS — F411 Generalized anxiety disorder: Secondary | ICD-10-CM | POA: Diagnosis not present

## 2018-02-14 DIAGNOSIS — K5900 Constipation, unspecified: Secondary | ICD-10-CM

## 2018-02-14 DIAGNOSIS — R1030 Lower abdominal pain, unspecified: Secondary | ICD-10-CM

## 2018-02-14 DIAGNOSIS — R339 Retention of urine, unspecified: Secondary | ICD-10-CM

## 2018-02-14 DIAGNOSIS — E559 Vitamin D deficiency, unspecified: Secondary | ICD-10-CM

## 2018-02-14 LAB — POCT URINALYSIS DIP (MANUAL ENTRY)
Bilirubin, UA: NEGATIVE
Blood, UA: NEGATIVE
Glucose, UA: NEGATIVE mg/dL
Ketones, POC UA: NEGATIVE mg/dL
Leukocytes, UA: NEGATIVE
Nitrite, UA: NEGATIVE
Spec Grav, UA: >=1.03 — AB (ref 1.010–1.025)
Urobilinogen, UA: 0.2 E.U./dL
pH, UA: 6 (ref 5.0–8.0)

## 2018-02-14 MED ORDER — POLYETHYLENE GLYCOL 3350 17 GM/SCOOP PO POWD
17.0000 g | Freq: Every day | ORAL | 1 refills | Status: DC
Start: 2018-02-14 — End: 2018-08-11

## 2018-02-14 NOTE — Patient Instructions (Addendum)
1. Lets do trial of not smoking marijuana and see if anxiety improves 2.you should hear for an appointment for a bladder ultrasound in 2 weeks 3. Push fluids (water) and lets manage constipation and see if symptoms resolve 4. If miralax if not helping, then try 1/2 bottle of magnesium citrate, can repeat 1/2 bottle in 2 days if needed.     IF you received an x-ray today, you will receive an invoice from [facility]. Please contact [facility] at [phone] with questions or concerns regarding your invoice.   IF you received labwork today, you will receive an invoice from [facility]. Please contact [facility] at [phone] with questions or concerns regarding your invoice.   Our billing staff will not be able to assist you with questions regarding bills from these companies.  You will be contacted with the lab results as soon as they are available. The fastest way to get your results is to activate your My Chart account. Instructions are located on the last page of this paperwork. If you have not heard from us regarding the results in 2 weeks, please contact this office.

## 2018-02-14 NOTE — Progress Notes (Signed)
6/15/201912:07 PM  Timothy Patterson 1996-09-01, 22 y.o. male [id number]  Chief Complaint  Patient presents with  . Groin Pain    having dull pain in the groin area since November of last yr    HPI:   Patient is a 22 y.o. male with past medical history significant for depression with psychosis requiring hospitalization in 2018 and GAD who presents today with several concerns  1. Stopped taking wellbutrin and abilify. wellbutrin was working for mood but causing more hallucinations. He was taking abilify in the morning and it was too sedating. He has since started working, Production assistant, radioserver at [employer]. His depression is much better and has not been an issue for a while. His hallucinations are still present, very mild, not intrusive or disturbing. His anxiety still present. He does smoke marijuana daily and use has increased with recent job. Meds tried in the past Citalopram - did not work Ritalin - did not work lexapro- cause SI seroquel - too sedating abilify - too sedating at low dose but was taking during AM Trazodone - works but sleep is good right now so no need wellbutrin - improved depression but with increase in hallucinations  Otherwise he is c/o of several months of suprapubic pressure Intermittent, feels at time unable to completely void. He denies any back pain, saddle anesthesia, or leg weakness He does suffer from constipation He denies any dysuria, hematuria, penile discharge He denies any malaise, fever, chills, nausea, vomiting.   Fall Risk  02/14/2018 10/07/2017 07/22/2017 07/16/2017 04/11/2017  Falls in the past year? No No No No No     Depression screen [facility] 2/9 02/14/2018 10/07/2017 07/22/2017  Decreased Interest 1 1 0  Down, Depressed, Hopeless - 1 0  PHQ - 2 Score 1 2 0  Altered sleeping - 1 -  Tired, decreased energy - 3 -  Change in appetite - 3 -  Feeling bad or failure about yourself  - 0 -  Trouble concentrating - 1 -  Moving slowly or  fidgety/restless - 2 -  Suicidal thoughts - 0 -  PHQ-9 Score - 12 -  Difficult doing work/chores - Not difficult at all -   GAD 7 : Generalized Anxiety Score 02/14/2018  Nervous, Anxious, on Edge 2  Control/stop worrying 1  Worry too much - different things 0  Trouble relaxing 2  Restless 3  Easily annoyed or irritable 0  Afraid - awful might happen 3  Total GAD 7 Score 11  Anxiety Difficulty Not difficult at all     No Known Allergies  Prior to Admission medications   Medication Sig Start Date End Date Taking? Authorizing Provider  ARIPiprazole (ABILIFY) 2 MG tablet Take 1 tablet (2 mg total) by mouth daily. Patient not taking: Reported on 02/14/2018 10/07/17   Myles LippsSantiago, Irma M, MD  buPROPion (WELLBUTRIN XL) 150 MG 24 hr tablet Take 1 tablet (150 mg total) by mouth every morning. Patient not taking: Reported on 02/14/2018 10/07/17   Myles LippsSantiago, Irma M, MD  Vitamin D, Ergocalciferol, (DRISDOL) 50000 units CAPS capsule Take 1 capsule (50,000 Units total) by mouth every 7 (seven) days. Patient not taking: Reported on 02/14/2018 10/12/17   Myles LippsSantiago, Irma M, MD    Past Medical History:  Diagnosis Date  . ADHD (attention deficit hyperactivity disorder)    HX of ADHD  . Anxiety   . Severe recurrent depression with psychosis [facility](HCC)    hospitalized 07/2017    Past Surgical History:  Procedure Laterality Date  .  ingrown toe nail      Social History   Tobacco Use  . Smoking status: Current Some Day Smoker    Types: Cigarettes  . Smokeless tobacco: Never Used  . Tobacco comment: not often  Substance Use Topics  . Alcohol use: Yes    Alcohol/week: 0.0 oz    Comment: occasional    Family History  Problem Relation Age of Onset  . Depression Mother   . Anxiety disorder Mother   . Bipolar disorder Mother   . Depression Sister   . Anxiety disorder Sister   . Hyperlipidemia Paternal Grandfather   . Bipolar disorder Maternal Grandmother   . Depression Maternal Grandmother      ROS Per hpi  OBJECTIVE:  Blood pressure 130/84, pulse 99, temperature 97.8 F (36.6 C), temperature source Oral, height 5' 8.7" (1.745 m), weight 173 lb 9.6 oz (78.7 kg), SpO2 99 %.  Physical Exam  Constitutional: He is oriented to person, place, and time. He appears well-developed and well-nourished.  HENT:  Head: Normocephalic and atraumatic.  Mouth/Throat: Oropharynx is clear and moist.  Eyes: Pupils are equal, round, and reactive to light. EOM are normal.  Neck: Neck supple.  Cardiovascular: Normal rate and regular rhythm. Exam reveals no gallop and no friction rub.  No murmur heard. Pulmonary/Chest: Effort normal and breath sounds normal. He has no wheezes. He has no rales.  Abdominal: Soft. Bowel sounds are normal. He exhibits no distension. There is no tenderness. Hernia confirmed negative in the right inguinal area and confirmed negative in the left inguinal area.  Genitourinary: Testes normal. Cremasteric reflex is present. Right testis shows no mass, no swelling and no tenderness. Left testis shows no mass, no swelling and no tenderness. Circumcised. No phimosis, paraphimosis, hypospadias or penile erythema.  Neurological: He is alert and oriented to person, place, and time.  Skin: Skin is warm and dry.  Psychiatric: His mood appears anxious.  Nursing note and vitals reviewed.    Results for orders placed or performed in visit on 02/14/18 (from the past 24 hour(s))  POCT urinalysis dipstick     Status: Abnormal   Collection Time: 02/14/18 12:28 PM  Result Value Ref Range   Color, UA yellow yellow   Clarity, UA clear clear   Glucose, UA negative negative mg/dL   Bilirubin, UA negative negative   Ketones, POC UA negative negative mg/dL   Spec Grav, UA >=1.610 (A) 1.010 - 1.025   Blood, UA negative negative   pH, UA 6.0 5.0 - 8.0   Protein Ur, POC trace (A) negative mg/dL   Urobilinogen, UA 0.2 0.2 or 1.0 E.U./dL   Nitrite, UA Negative Negative   Leukocytes, UA  Negative Negative    Dg Abd 2 Views  Result Date: 02/14/2018 CLINICAL DATA:  22 year old male with a history of constipation EXAM: ABDOMEN - 2 VIEW COMPARISON:  None. FINDINGS: The bowel gas pattern is normal. There is no evidence of free air. No radio-opaque calculi or other significant radiographic abnormality is seen. Moderate stool burden of the right colon, rectum, transverse colon. IMPRESSION: Nonobstructive bowel gas pattern. Moderate stool burden. Electronically Signed   By: Gilmer Mor D.O.   On: 02/14/2018 12:57     ASSESSMENT and PLAN  1. GAD (generalized anxiety disorder) Discussed effects of marijuana use of anxiety. Will do trial off marijuana. Consider buspar.  2. Lower abdominal pain Discussed might be related to constipation cause bladder irritation. Will order [place] with PVR to evaluate for true retention.  RTC precautions given. - POCT urinalysis dipstick  3. Vitamin D deficiency - Vitamin D, 25-hydroxy  4. Urinary retention - Basic Metabolic Panel - [place], retroperitnl abd,  ltd  5. Constipation, unspecified constipation type - DG Abd 2 Views; Future  Other orders - polyethylene glycol powder (GLYCOLAX/MIRALAX) powder; Take 17 g by mouth daily.  Return in about 6 weeks (around 03/28/2018).    Myles Lipps, MD Primary Care at [facility] [address] [place], [place] [address] Ph.  [phone] Fax [phone]

## 2018-02-16 LAB — BASIC METABOLIC PANEL
BUN/Creatinine Ratio: 20 (ref 9–20)
BUN: 17 mg/dL (ref 6–20)
CO2: 21 mmol/L (ref 20–29)
Calcium: 9.7 mg/dL (ref 8.7–10.2)
Chloride: 104 mmol/L (ref 96–106)
Creatinine, Ser: 0.84 mg/dL (ref 0.76–1.27)
GFR calc Af Amer: 147 mL/min/{1.73_m2} (ref >59)
GFR calc non Af Amer: 127 mL/min/{1.73_m2} (ref >59)
Glucose: 91 mg/dL (ref 65–99)
Potassium: 4.4 mmol/L (ref 3.5–5.2)
Sodium: 139 mmol/L (ref 134–144)

## 2018-02-16 LAB — VITAMIN D 25 HYDROXY (VIT D DEFICIENCY, FRACTURES): Vit D, 25-Hydroxy: 33.7 ng/mL (ref 30.0–100.0)

## 2018-02-23 ENCOUNTER — Telehealth: Payer: Self-pay | Admitting: Family Medicine

## 2018-02-23 NOTE — Telephone Encounter (Unsigned)
Copied from CRM [id number]. Topic: Quick Communication - Lab Results >> Feb 23, 2018 11:09 AM Raquel SarnaHayes, Teresa G wrote: Pt calling back to discuss lab results.  Please call pt back AFTER 4 pm today at [phone].

## 2018-02-24 NOTE — Telephone Encounter (Signed)
Spoke with pt. Gave him lab results.  He is unable to get into mychart - gave him IT number to get help

## 2018-03-20 ENCOUNTER — Telehealth (HOSPITAL_COMMUNITY): Payer: Self-pay | Admitting: Psychology

## 2018-03-20 ENCOUNTER — Encounter (HOSPITAL_COMMUNITY): Payer: Self-pay | Admitting: Psychology

## 2018-03-20 NOTE — Addendum Note (Signed)
Addended byLogan Bores: Vyctoria Dickman on: 03/20/2018 09:36 AM   Modules accepted: Level of Service
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
## 2018-03-30 ENCOUNTER — Ambulatory Visit: Payer: BC Managed Care – PPO | Admitting: Physician Assistant

## 2018-03-31 ENCOUNTER — Ambulatory Visit (INDEPENDENT_AMBULATORY_CARE_PROVIDER_SITE_OTHER): Payer: BC Managed Care – PPO | Admitting: Family Medicine

## 2018-03-31 ENCOUNTER — Other Ambulatory Visit: Payer: Self-pay

## 2018-03-31 VITALS — BP 138/87 | HR 89 | Temp 98.9°F | Ht 69.29 in | Wt 177.6 lb

## 2018-03-31 DIAGNOSIS — F331 Major depressive disorder, recurrent, moderate: Secondary | ICD-10-CM

## 2018-03-31 DIAGNOSIS — F411 Generalized anxiety disorder: Secondary | ICD-10-CM | POA: Diagnosis not present

## 2018-03-31 MED ORDER — DULOXETINE HCL 30 MG PO CPEP: 30.0000 mg | ORAL_CAPSULE | Freq: Every day | ORAL | 3 refills | Status: DC

## 2018-03-31 NOTE — Progress Notes (Signed)
7/30/201912:02 PM  Timothy Patterson Jun 08, 1996, 23 y.o. male [id number]  Chief Complaint  Patient presents with  . Anxiety    Has an appt with the therapist this wk. Wanting to know if he is to cntinue with PCP due to being under therapist    HPI:   Patient is a 22 y.o. male with past medical history significant for GAD and depression with pyschosis who presents today for routine followup  Since our last visit he was overall doing ok until last several days when good friends since high school stopped talking to him. They told him that he was not the same person anymore, that basically he was a downer  Patient however does not feel that he has been depressed.  He has registered for college and is very excited about that Decided to quit his job because he wants to focus full time on school He states that work was interfering with his time for self-care and his personal hygiene was suffering  He has been smoking more THC of recent since issue with his friend Feeling more anxious and down about this  States hallucinations have also become a bit more clear. Voices never speak to him, not commanding, but recently he has been able to make sense of the conversations. He does not find them disturbing nor distracting  He states he is sleeping well  He has upcoming appointment with counseling and is looking forward to this  He is not on any medications.   Meds tried in the past Citalopram - did not work Ritalin - did not work lexapro- cause SI seroquel - too sedating abilify - too sedating at low dose but was taking during AM Trazodone - works but sleep is good right now so no need wellbutrin - improved depression but with increase in hallucinations      Fall Risk  03/31/2018 02/14/2018 10/07/2017 07/22/2017 07/16/2017  Falls in the past year? No No No No No     Depression screen [facility] 2/9 02/14/2018 10/07/2017 07/22/2017  Decreased Interest 1 1 0  Down, Depressed, Hopeless - 1  0  PHQ - 2 Score 1 2 0  Altered sleeping - 1 -  Tired, decreased energy - 3 -  Change in appetite - 3 -  Feeling bad or failure about yourself  - 0 -  Trouble concentrating - 1 -  Moving slowly or fidgety/restless - 2 -  Suicidal thoughts - 0 -  PHQ-9 Score - 12 -  Difficult doing work/chores - Not difficult at all -   GAD 7 : Generalized Anxiety Score 03/31/2018 02/14/2018  Nervous, Anxious, on Edge 3 2  Control/stop worrying 1 1  Worry too much - different things 3 0  Trouble relaxing 3 2  Restless 2 3  Easily annoyed or irritable 0 0  Afraid - awful might happen 3 3  Total GAD 7 Score 15 11  Anxiety Difficulty Extremely difficult Not difficult at all     No Known Allergies  Prior to Admission medications   Medication Sig Start Date End Date Taking? Authorizing Provider  polyethylene glycol powder (GLYCOLAX/MIRALAX) powder Take 17 g by mouth daily. 02/14/18  Yes Myles Lipps, MD    Past Medical History:  Diagnosis Date  . ADHD (attention deficit hyperactivity disorder)    HX of ADHD  . Anxiety   . 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  ingrown toe nail      Social History   Tobacco Use  . Smoking status: Current Some Day Smoker    Types: Cigarettes  . Smokeless tobacco: Never Used  . Tobacco comment: not often  Substance Use Topics  . Alcohol use: Yes    Alcohol/week: 0.0 oz    Comment: occasional    Family History  Problem Relation Age of Onset  . Depression Mother   . Anxiety disorder Mother   . Bipolar disorder Mother   . Depression Sister   . Anxiety disorder Sister   . Hyperlipidemia Paternal Grandfather   . Bipolar disorder Maternal Grandmother   . Depression Maternal Grandmother     ROS Per hpi  OBJECTIVE:  Blood pressure 138/87, pulse 89, temperature 98.9 F (37.2 C), temperature source Oral, height 5' 9.29" (1.76 m), weight 177 lb 9.6 oz  (80.6 kg), SpO2 98 %. Body mass index is 26.01 kg/m.   Physical Exam  Constitutional: He is oriented to person, place, and time. He appears well-developed and well-nourished.  HENT:  Head: Normocephalic and atraumatic.  Mouth/Throat: Mucous membranes are normal.  Eyes: Pupils are equal, round, and reactive to light. Conjunctivae and EOM are normal.  Neck: Neck supple.  Pulmonary/Chest: Effort normal.  Musculoskeletal: Normal range of motion.  Neurological: He is alert and oriented to person, place, and time. Gait normal.  Skin: Skin is warm and dry.  Psychiatric: His mood appears anxious.  Nursing note and vitals reviewed.    ASSESSMENT and PLAN  1. GAD (generalized anxiety disorder) 2. Moderate episode of recurrent major depressive disorder (HCC) Over 50% of this 25 minutes visit was spent providing counseling regarding supportive techniques for anxiety and contributions of THC to long-term worsening anxiety. Discussed treatment options. Patient having currently more issues with anxiety and would like to address. He feels depression doing ok, forwards thinking and excited about the future. Appropriately sad about issue with friends but feels he is coping well. Looking forward to counseling, seeked out himself. Given SI with lexapro, will start different class of meds. Will do trial of SNRI instead. New med r/se/b Continue counseling.   Other orders - DULoxetine (CYMBALTA) 30 MG capsule; Take 1 capsule (30 mg total) by mouth daily.   Return in about 1 month (around 04/28/2018).    Myles LippsIrma M Santiago, MD [facility] [address] [place], [place] [address] Ph.  [phone] Fax [phone]

## 2018-03-31 NOTE — Patient Instructions (Signed)
     IF you received an x-ray today, you will receive an invoice from [facility]. Please contact  Radiology at [phone] with questions or concerns regarding your invoice.   IF you received labwork today, you will receive an invoice from [facility]. Please contact [facility] at [phone] with questions or concerns regarding your invoice.   Our billing staff will not be able to assist you with questions regarding bills from these companies.  You will be contacted with the lab results as soon as they are available. The fastest way to get your results is to activate your My Chart account. Instructions are located on the last page of this paperwork. If you have not heard from us regarding the results in 2 weeks, please contact this office.     

## 2018-04-01 ENCOUNTER — Telehealth: Payer: Self-pay | Admitting: Family Medicine
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
## 2018-04-07 ENCOUNTER — Ambulatory Visit: Payer: BC Managed Care – PPO | Admitting: Family Medicine

## 2018-04-11 ENCOUNTER — Encounter: Payer: Self-pay | Admitting: Family Medicine

## 2018-05-02 ENCOUNTER — Ambulatory Visit: Payer: BC Managed Care – PPO | Admitting: Family Medicine

## 2018-08-10 ENCOUNTER — Ambulatory Visit: Payer: Self-pay | Admitting: *Deleted

## 2018-08-10 NOTE — Telephone Encounter (Signed)
Patient has experienced Right-sided middle to upper chest pain he states is a dull ache. Reports this pain has developed over several months with his singing performances. Stated he noticed it last night while "holding a long note during a singing performance for school". He has nasal congestion and discharge at this time. No fever. At times, when he breaths in deep, feels like his lungs don't completely expand. No wheezing/cough. This comes and goes and is not present at this time. Reviewed symptoms that would require immediate emergency evaluation. Stated he understood. Appointment scheduled with PCP tomorrow.   Reason for Disposition . [1] Chest pain lasting <= 5 minutes AND [2] NO chest pain or cardiac symptoms now(Exceptions: pains lasting a few seconds)  Answer Assessment - Initial Assessment Questions 1. LOCATION: "Where does it hurt?"       Right sided near middle to upper. 2. RADIATION: "Does the pain go anywhere else?" (e.g., into neck, jaw, arms, back)     unsure 3. ONSET: "When did the chest pain begin?" (Minutes, hours or days)      Has been developing over a few months 4. PATTERN "Does the pain come and go, or has it been constant since it started?"  "Does it get worse with exertion?"      Comes and goes. Worse with singing.  5. DURATION: "How long does it last" (e.g., seconds, minutes, hours)     Unable to say exactly but resolves when he stops the activity he is doing.  6. SEVERITY: "How bad is the pain?"  (e.g., Scale 1-10; mild, moderate, or severe)    - MILD (1-3): doesn't interfere with normal activities     - MODERATE (4-7): interferes with normal activities or awakens from sleep    - SEVERE (8-10): excruciating pain, unable to do any normal activities       Moderate with a dull achy pain. 7. CARDIAC RISK FACTORS: "Do you have any history of heart problems or risk factors for heart disease?" (e.g., prior heart attack, angina; high blood pressure, diabetes, being overweight,  high cholesterol, smoking, or strong family history of heart disease)     4-5 months since he has used nicotene on a regular basis.  8. PULMONARY RISK FACTORS: "Do you have any history of lung disease?"  (e.g., blood clots in lung, asthma, emphysema, birth control pills)     no 9. CAUSE: "What do you think is causing the chest pain?"     unsure 10. OTHER SYMPTOMS: "Do you have any other symptoms?" (e.g., dizziness, nausea, vomiting, sweating, fever, difficulty breathing, cough)       Congestion with nasal discharge. 11. PREGNANCY: "Is there any chance you are pregnant?" "When was your last menstrual period?"       na  Protocols used: CHEST PAIN-A-AH

## 2018-08-11 ENCOUNTER — Encounter: Payer: Self-pay | Admitting: Family Medicine

## 2018-08-11 ENCOUNTER — Ambulatory Visit: Payer: BC Managed Care – PPO | Admitting: Family Medicine

## 2018-08-11 ENCOUNTER — Other Ambulatory Visit: Payer: Self-pay

## 2018-08-11 VITALS — BP 136/79 | HR 102 | Temp 98.9°F | Resp 16 | Ht 69.0 in | Wt 175.0 lb

## 2018-08-11 DIAGNOSIS — R0602 Shortness of breath: Secondary | ICD-10-CM | POA: Diagnosis not present

## 2018-08-11 DIAGNOSIS — M940 Chondrocostal junction syndrome [Tietze]: Secondary | ICD-10-CM | POA: Diagnosis not present

## 2018-08-11 DIAGNOSIS — F411 Generalized anxiety disorder: Secondary | ICD-10-CM | POA: Diagnosis not present

## 2018-08-11 MED ORDER — IBUPROFEN 600 MG PO TABS: 600.0000 mg | ORAL_TABLET | Freq: Three times a day (TID) | ORAL | 0 refills | Status: DC | PRN

## 2018-08-11 MED ORDER — DULOXETINE HCL 30 MG PO CPEP: 30.0000 mg | ORAL_CAPSULE | Freq: Every day | ORAL | 3 refills | Status: DC

## 2018-08-11 NOTE — Patient Instructions (Addendum)
If you have lab work done today you will be contacted with your lab results within the next 2 weeks.  If you have not heard from [place] then please contact us. The fastest way to get your results is to register for My Chart.   IF you received an x-ray today, you will receive an invoice from [facility]. Please contact [facility] at [phone] with questions or concerns regarding your invoice.   IF you received labwork today, you will receive an invoice from [place]. Please contact [facility] at [phone] with questions or concerns regarding your invoice.   Our billing staff will not be able to assist you with questions regarding bills from these companies.  You will be contacted with the lab results as soon as they are available. The fastest way to get your results is to activate your My Chart account. Instructions are located on the last page of this paperwork. If you have not heard from [place] regarding the results in 2 weeks, please contact this office.     cos Costochondritis Costochondritis is swelling and irritation (inflammation) of the tissue (cartilage) that connects your ribs to your breastbone (sternum). This causes pain in the front of your chest. The pain usually starts gradually and involves more than one rib. What are the causes? The exact cause of this condition is not always known. It results from stress on the cartilage where your ribs attach to your sternum. The cause of this stress could be:  Chest injury (trauma).  Exercise or activity, such as lifting.  Severe coughing.  What increases the risk? You may be at higher risk for this condition if you:  Are male.  Are 25?20 years old.  Recently started a new exercise or work activity.  Have low levels of vitamin D.  Have a condition that makes you cough frequently.  What are the signs or symptoms? The main symptom of this condition is chest pain. The pain:  Usually starts gradually  and can be sharp or dull.  Gets worse with deep breathing, coughing, or exercise.  Gets better with rest.  May be worse when you press on the sternum-rib connection (tenderness).  How is this diagnosed? This condition is diagnosed based on your symptoms, medical history, and a physical exam. Your health care provider will check for tenderness when pressing on your sternum. This is the most important finding. You may also have tests to rule out other causes of chest pain. These may include:  A chest X-ray to check for lung problems.  An electrocardiogram (ECG) to see if you have a heart problem that could be causing the pain.  An imaging scan to rule out a chest or rib fracture.  How is this treated? This condition usually goes away on its own over time. Your health care provider may prescribe an NSAID to reduce pain and inflammation. Your health care provider may also suggest that you:  Rest and avoid activities that make pain worse.  Apply heat or cold to the area to reduce pain and inflammation.  Do exercises to stretch your chest muscles.  If these treatments do not help, your health care provider may inject a numbing medicine at the sternum-rib connection to help relieve the pain. Follow these instructions at home:  Avoid activities that make pain worse. This includes any activities that use chest, abdominal, and side muscles.  If directed, put ice on the painful area: ? Put ice in a plastic bag. ?  Place a towel between your skin and the bag. ? Leave the ice on for 20 minutes, 2-3 times a day.  If directed, apply heat to the affected area as often as told by your health care provider. Use the heat source that your health care provider recommends, such as a moist heat pack or a heating pad. ? Place a towel between your skin and the heat source. ? Leave the heat on for 20-30 minutes. ? Remove the heat if your skin turns bright red. This is especially important if you are  unable to feel pain, heat, or cold. You may have a greater risk of getting burned.  Take over-the-counter and prescription medicines only as told by your health care provider.  Return to your normal activities as told by your health care provider. Ask your health care provider what activities are safe for you.  Keep all follow-up visits as told by your health care provider. This is important. Contact a health care provider if:  You have chills or a fever.  Your pain does not go away or it gets worse.  You have a cough that does not go away (is persistent). Get help right away if:  You have shortness of breath. This information is not intended to replace advice given to you by your health care provider. Make sure you discuss any questions you have with your health care provider. Document Released: 05/29/2005 Document Revised: 03/08/2016 Document Reviewed: 12/13/2015 Elsevier Interactive Patient Education  [employer]2018 Elsevier Inc.

## 2018-08-11 NOTE — Progress Notes (Signed)
12/10/20194:33 PM  Rolla Flattenicholas D Tietje 1995/09/17, 22 y.o. male [id number]  Chief Complaint  Patient presents with  . chest discomfort    chest discomfort when singing for the past few months, pain is dull, center right side of chest, do not feel like lungs are exspanding like it supposed to. Denies having asthma     HPI:   Patient is a 22 y.o. male with past medical history significant for depression with psychosis and anxiety who presents today for chest tightness with singing  Right chest sided pain present for months, choir Most obvious when he sings or if he takes a very deep breath Not triggered by movement Pain dull and deep Does not feel SOB, no cough No night sweats or fevers Maybe occ chills  Doing well on cymbalta, happy with current dose Feels anxiety and depression Feels that hallucinations have also calmed down  Fall Risk  03/31/2018 02/14/2018 10/07/2017 07/22/2017 07/16/2017  Falls in the past year? No No No No No     [facility] 2[facility] 08/11/2018 02/14/2018 10/07/2017  Decreased Interest 1 1 1   Down, Depressed, Hopeless 2 - 1  PHQ - 2 Score 3 1 2   Altered sleeping 3 - 1  Tired, decreased energy 1 - 3  Change in appetite 1 - 3  Feeling bad or failure about yourself  0 - 0  Trouble concentrating 1 - 1  Moving slowly or fidgety/restless 2 - 2  Suicidal thoughts 0 - 0  PHQ-9 Score 11 - 12  Difficult doing work/chores Very difficult - Not difficult at all    No Known Allergies  Prior to Admission medications   Medication Sig Start Date End Date Taking? Authorizing Provider  DULoxetine (CYMBALTA) 30 MG capsule Take 1 capsule (30 mg total) by mouth daily. 03/31/18  Yes Myles LippsSantiago, Irma M, MD    Past Medical History:  Diagnosis Date  . ADHD (attention deficit hyperactivity disorder)    HX of ADHD  . Anxiety   . Severe recurrent depression with psychosis [facility](HCC)    hospitalized 07/2017    Past Surgical History:  Procedure Laterality Date  .  ingrown toe nail      Social History   Tobacco Use  . Smoking status: Current Some Day Smoker    Types: Cigarettes  . Smokeless tobacco: Never Used  . Tobacco comment: not often  Substance Use Topics  . Alcohol use: Yes    Alcohol/week: 0.0 standard drinks    Comment: occasional    Family History  Problem Relation Age of Onset  . Depression Mother   . Anxiety disorder Mother   . Bipolar disorder Mother   . Depression Sister   . Anxiety disorder Sister   . Hyperlipidemia Paternal Grandfather   . Bipolar disorder Maternal Grandmother   . Depression Maternal Grandmother     ROS Per hpi  OBJECTIVE:  Blood pressure 136/79, pulse (!) 102, temperature 98.9 F (37.2 C), temperature source Oral, resp. rate 16, height 5\' 9"  (1.753 m), weight 175 lb (79.4 kg), SpO2 98 %, peak flow 550 L/min. Body mass index is 25.84 kg/m.   Physical Exam  Constitutional: He is oriented to person, place, and time. He appears well-developed and well-nourished.  HENT:  Head: Normocephalic and atraumatic.  Mouth/Throat: Oropharynx is clear and moist.  Eyes: Pupils are equal, round, and reactive to light. Conjunctivae and EOM are normal.  Neck: Neck supple.  Cardiovascular: Normal rate and regular rhythm. Exam reveals no gallop and  no friction rub.  No murmur heard. Pulmonary/Chest: Effort normal and breath sounds normal. He has no wheezes. He has no rhonchi. He has no rales. He exhibits tenderness (costosternal joint Rib 6-7). He exhibits no bony tenderness, no edema and no swelling.  Musculoskeletal: He exhibits no edema.  Neurological: He is alert and oriented to person, place, and time.  Skin: Skin is warm and dry.  Psychiatric: He has a normal mood and affect.  Nursing note and vitals reviewed.    Peak flow reading is 550, about 93 % of predicted.   ASSESSMENT and PLAN  1. Costochondritis Discussed supportive measures, new meds r/se/b and RTC precautions. Patient educational handout  given.  2. GAD (generalized anxiety disorder) Controlled. Continue current regime.   3. SOB (shortness of breath) - Check Peak Flow  Other orders - ibuprofen (ADVIL,MOTRIN) 600 MG tablet; Take 1 tablet (600 mg total) by mouth every 8 (eight) hours as needed. - DULoxetine (CYMBALTA) 30 MG capsule; Take 1 capsule (30 mg total) by mouth daily.    Return if symptoms worsen or fail to improve.    Myles Lipps, MD Primary Care at [facility] [address] [place], [place] [address] Ph.  [phone] Fax [phone]

## 2018-12-21 ENCOUNTER — Ambulatory Visit: Payer: Self-pay | Admitting: *Deleted

## 2018-12-21 NOTE — Telephone Encounter (Signed)
Pt reports blood in stools. States has had off and on for a year, worsening past 3 days. States water light red, "few drops of blood." States first day of 3 day period noted a small clot. States lessening past 2 days. Concerned as his brother has been recently diagnosed  with IBS "Or something similar." Denies nausea, vomiting, dizziness. Does report mild lower abdominal pain, "Not as bad when I hydrate well."  Pt requesting appt with Dr. Leretha Pol. Email verified, phone # verified.  Pt transferred to practice, spoke with [facility] for consideration of appt. CB# [phone]  Reason for Disposition . MILD rectal bleeding (more than just a few drops or streaks)  Answer Assessment - Initial Assessment Questions 1. APPEARANCE of BLOOD: "What color is it?" "Is it passed separately, on the surface of the stool, or mixed in with the stool?"      Light red, both on surface and mixed in 2. AMOUNT: "How much blood was passed?"      Streaks and also few drops in water; 1st day possible clot 3. FREQUENCY: "How many times has blood been passed with the stools?"      Each stool last 3 days 4. ONSET: "When was the blood first seen in the stools?" (Days or weeks)     Year ago 5. DIARRHEA: "Is there also some diarrhea?" If so, ask: "How many diarrhea stools were passed in past 24 hours?"      Some at times 6. CONSTIPATION: "Do you have constipation?" If so, "How bad is it?"     no 7. RECURRENT SYMPTOMS: "Have you had blood in your stools before?" If so, ask: "When was the last time?" and "What happened that time?"      Yes, over past year 8. BLOOD THINNERS: "Do you take any blood thinners?" (e.g., Coumadin/warfarin, Pradaxa/dabigatran, aspirin)     no 9. OTHER SYMPTOMS: "Do you have any other symptoms?"  (e.g., abdominal pain, vomiting, dizziness, fever)    Lower abdominal pain, intermittent, not daily.Better when hydrated. Brother diagnosed with IBS.  Protocols used: RECTAL BLEEDING-A-AH

## 2018-12-23 NOTE — Telephone Encounter (Signed)
12/28/2018 per chart with Dr. Leretha Pol

## 2018-12-28 ENCOUNTER — Other Ambulatory Visit: Payer: Self-pay

## 2018-12-28 ENCOUNTER — Encounter: Payer: Self-pay | Admitting: Family Medicine

## 2018-12-28 ENCOUNTER — Ambulatory Visit (INDEPENDENT_AMBULATORY_CARE_PROVIDER_SITE_OTHER): Payer: BC Managed Care – PPO | Admitting: Family Medicine

## 2018-12-28 ENCOUNTER — Other Ambulatory Visit (HOSPITAL_COMMUNITY)
Admission: RE | Admit: 2018-12-28 | Discharge: 2018-12-28 | Disposition: A | Discharge status: Home / Self Care | Payer: BC Managed Care – PPO | Source: Ambulatory Visit | Attending: Family Medicine | Admitting: Family Medicine

## 2018-12-28 VITALS — BP 135/72 | HR 72 | Temp 98.5°F | Ht 69.0 in | Wt 184.0 lb

## 2018-12-28 DIAGNOSIS — F333 Major depressive disorder, recurrent, severe with psychotic symptoms: Secondary | ICD-10-CM | POA: Diagnosis not present

## 2018-12-28 DIAGNOSIS — F411 Generalized anxiety disorder: Secondary | ICD-10-CM

## 2018-12-28 DIAGNOSIS — K625 Hemorrhage of anus and rectum: Secondary | ICD-10-CM

## 2018-12-28 DIAGNOSIS — Z113 Encounter for screening for infections with a predominantly sexual mode of transmission: Secondary | ICD-10-CM | POA: Insufficient documentation

## 2018-12-28 NOTE — Progress Notes (Signed)
4/27/202011:23 AM  Timothy Patterson 1996/01/23, 23 y.o., male [id number]  Chief Complaint  Patient presents with  . Rectal Bleeding    on and off for the past months. Denies being constipated, hs been having anal sex but not recent  . Anxiety    no longer taking the cymbalta, says ot made him feel weird. Possibly looking to start anither medication    HPI:   Patient is a 23 y.o. male with past medical history significant for ADD, depression with psychosis and anxiety who presents today for rectal bleeding and anxiety  Last OV Dec 2019 - was doing well on low dose cymbalta  Stopped cymbalta due to starting to increase visual and auditory hallucinations Hallucinations have gone back to the background, having delusions around religion  Feels that depression has become more manageable Anxiety is still present   Has been having recurring episodes of red blood in stool, sometimes with clots Sometimes appears closer to maroon in color Associated with pain, dull pain, sometimes it to testicles, sometimes feels burning sensation in deep into rectum Pain with BM only if straining Having gassiness, bloating Overall has normal BM with occ feeling of not completed emptying, thinner stools Reports hemorrhoid creams have not helped  Brother has been diagnosed with "something" Denies any fhx colon cancer Denies any fever, chills occ anorexia but overall good appetite Nausea only when he has not eaten Weight has been stable Denies any night sweats  Requesting STD testing Denies any sx    Wt Readings from Last 3 Encounters:  12/28/18 184 lb (83.5 kg)  08/11/18 175 lb (79.4 kg)  03/31/18 177 lb 9.6 oz (80.6 kg)    meds tried Lexparo - increased SI thoughts wellbutrin - increased hallucinations abilify 2mg  - too sedating seroquel 100mg  - too sedating  Fall Risk  12/28/2018 03/31/2018 02/14/2018 10/07/2017 07/22/2017  Falls in the past year? 0 No No No No  Number falls in past  yr: 0 - - - -  Injury with Fall? 0 - - - -     Depression screen [facility] 2/9 12/28/2018 08/11/2018 02/14/2018  Decreased Interest 0 1 1  Down, Depressed, Hopeless 0 2 -  PHQ - 2 Score 0 3 1  Altered sleeping - 3 -  Tired, decreased energy - 1 -  Change in appetite - 1 -  Feeling bad or failure about yourself  - 0 -  Trouble concentrating - 1 -  Moving slowly or fidgety/restless - 2 -  Suicidal thoughts - 0 -  PHQ-9 Score - 11 -  Difficult doing work/chores - Very difficult -    No Known Allergies  Prior to Admission medications   Not on File    Past Medical History:  Diagnosis Date  . ADHD (attention deficit hyperactivity disorder)    HX of ADHD  . Anxiety   . Severe recurrent depression with psychosis [facility])    hospitalized 07/2017    Past Surgical History:  Procedure Laterality Date  . ingrown toe nail      Social History   Tobacco Use  . Smoking status: Current Some Day Smoker    Types: Cigarettes  . Smokeless tobacco: Never Used  . Tobacco comment: not often  Substance Use Topics  . Alcohol use: Yes    Alcohol/week: 0.0 standard drinks    Comment: occasional    Family History  Problem Relation Age of Onset  . Depression Mother   . Anxiety disorder Mother   .  Bipolar disorder Mother   . Depression Sister   . Anxiety disorder Sister   . Hyperlipidemia Paternal Grandfather   . Bipolar disorder Maternal Grandmother   . Depression Maternal Grandmother     ROS Per hpi  OBJECTIVE:  Today's Vitals   12/28/18 1100  BP: 135/72  Pulse: 72  Temp: 98.5 F (36.9 C)  TempSrc: Oral  SpO2: 100%  Weight: 184 lb (83.5 kg)  Height: 5\' 9"  (1.753 m)   Body mass index is 27.17 kg/m.   Physical Exam Vitals signs and nursing note reviewed. Exam conducted with a chaperone present.  Constitutional:      Appearance: He is well-developed.  HENT:     Head: Normocephalic and atraumatic.  Eyes:     Conjunctiva/sclera: Conjunctivae normal.     Pupils: Pupils  are equal, round, and reactive to light.  Neck:     Musculoskeletal: Neck supple.  Pulmonary:     Effort: Pulmonary effort is normal.  Genitourinary:    Rectum: No mass, tenderness, anal fissure, external hemorrhoid or internal hemorrhoid. Normal anal tone.  Skin:    General: Skin is warm and dry.  Neurological:     Mental Status: He is alert and oriented to person, place, and time.  Psychiatric:        Attention and Perception: He perceives auditory and visual hallucinations.        Mood and Affect: Affect is blunt.        Speech: Speech normal.        Behavior: Behavior is cooperative.        Thought Content: Thought content is delusional. Thought content is not paranoid. Thought content does not include homicidal or suicidal ideation.     ASSESSMENT and PLAN  1. Bright red blood per rectum - CBC with Differential/Platelet - Comprehensive metabolic panel - Sedimentation Rate - C-reactive protein - Ambulatory referral to Gastroenterology  2. Screen for STD (sexually transmitted disease) - HIV Antibody (routine testing w rflx) - Hepatitis C antibody - RPR - GC/Chlamydia probe amp ([place] - Trichomonas vaginalis, RNA  3. Severe episode of recurrent major depressive disorder, with psychotic features (HCC) - Ambulatory referral to Psychiatry  4. GAD (generalized anxiety disorder) - Ambulatory referral to Psychiatry  Return in about 6 weeks (around 02/08/2019).    Myles LippsIrma M Santiago, MD Primary Care at [facility] [address] [place], [place] [address] Ph.  [phone] Fax [phone]

## 2018-12-28 NOTE — Addendum Note (Signed)
Addended by: Shon Hale on: 12/28/2018 12:13 PM   Modules accepted: Orders

## 2018-12-28 NOTE — Patient Instructions (Signed)
° ° ° °  If you have lab work done today you will be contacted with your lab results within the next 2 weeks.  If you have not heard from us then please contact us. The fastest way to get your results is to register for My Chart. ° ° °IF you received an x-ray today, you will receive an invoice from [facility]. Please contact [facility] at [phone] with questions or concerns regarding your invoice.  ° °IF you received labwork today, you will receive an invoice from [facility]. Please contact [facility] at [phone] with questions or concerns regarding your invoice.  ° °Our billing staff will not be able to assist you with questions regarding bills from these companies. ° °You will be contacted with the lab results as soon as they are available. The fastest way to get your results is to activate your My Chart account. Instructions are located on the last page of this paperwork. If you have not heard from us regarding the results in 2 weeks, please contact this office. °  ° ° ° °

## 2018-12-29 LAB — CBC WITH DIFFERENTIAL/PLATELET
Basophils Absolute: 0 10*3/uL (ref 0.0–0.2)
Basos: 0 %
EOS (ABSOLUTE): 0.1 10*3/uL (ref 0.0–0.4)
Eos: 2 %
Hematocrit: 43 % (ref 37.5–51.0)
Hemoglobin: 15.6 g/dL (ref 13.0–17.7)
Immature Grans (Abs): 0.1 10*3/uL (ref 0.0–0.1)
Immature Granulocytes: 1 %
Lymphocytes Absolute: 3.1 10*3/uL (ref 0.7–3.1)
Lymphs: 45 %
MCH: 31.1 pg (ref 26.6–33.0)
MCHC: 36.3 g/dL — ABNORMAL HIGH (ref 31.5–35.7)
MCV: 86 fL (ref 79–97)
Monocytes Absolute: 0.6 10*3/uL (ref 0.1–0.9)
Monocytes: 8 %
Neutrophils Absolute: 3 10*3/uL (ref 1.4–7.0)
Neutrophils: 44 %
Platelets: 260 10*3/uL (ref 150–450)
RBC: 5.02 x10E6/uL (ref 4.14–5.80)
RDW: 13.2 % (ref 11.6–15.4)
WBC: 6.9 10*3/uL (ref 3.4–10.8)

## 2018-12-29 LAB — COMPREHENSIVE METABOLIC PANEL
ALT: 33 IU/L (ref 0–44)
AST: 19 IU/L (ref 0–40)
Albumin/Globulin Ratio: 1.9 (ref 1.2–2.2)
Albumin: 4.9 g/dL (ref 4.1–5.2)
Alkaline Phosphatase: 58 IU/L (ref 39–117)
BUN/Creatinine Ratio: 14 (ref 9–20)
BUN: 12 mg/dL (ref 6–20)
Bilirubin Total: 0.8 mg/dL (ref 0.0–1.2)
CO2: 22 mmol/L (ref 20–29)
Calcium: 9.9 mg/dL (ref 8.7–10.2)
Chloride: 102 mmol/L (ref 96–106)
Creatinine, Ser: 0.85 mg/dL (ref 0.76–1.27)
GFR calc Af Amer: 145 mL/min/{1.73_m2} (ref >59)
GFR calc non Af Amer: 125 mL/min/{1.73_m2} (ref >59)
Globulin, Total: 2.6 g/dL (ref 1.5–4.5)
Glucose: 89 mg/dL (ref 65–99)
Potassium: 4.5 mmol/L (ref 3.5–5.2)
Sodium: 139 mmol/L (ref 134–144)
Total Protein: 7.5 g/dL (ref 6.0–8.5)

## 2018-12-29 LAB — RPR: RPR Ser Ql: NONREACTIVE

## 2018-12-29 LAB — HIV ANTIBODY (ROUTINE TESTING W REFLEX): HIV Screen 4th Generation wRfx: NONREACTIVE

## 2018-12-29 LAB — C-REACTIVE PROTEIN: CRP: 1 mg/L (ref 0–10)

## 2018-12-29 LAB — SEDIMENTATION RATE: Sed Rate: 4 mm/hr (ref 0–15)

## 2018-12-29 LAB — URINE CYTOLOGY ANCILLARY ONLY
Chlamydia: NEGATIVE
Neisseria Gonorrhea: NEGATIVE

## 2018-12-29 LAB — HEPATITIS C ANTIBODY: Hep C Virus Ab: <0.1 s/co ratio (ref 0.0–0.9)

## 2019-01-11 ENCOUNTER — Ambulatory Visit (HOSPITAL_COMMUNITY): Payer: BC Managed Care – PPO | Admitting: Licensed Clinical Social Worker

## 2019-02-09 ENCOUNTER — Telehealth: Payer: Self-pay | Admitting: General Surgery

## 2019-02-09 ENCOUNTER — Encounter: Payer: Self-pay | Admitting: Gastroenterology

## 2019-02-09 ENCOUNTER — Ambulatory Visit (INDEPENDENT_AMBULATORY_CARE_PROVIDER_SITE_OTHER): Payer: BC Managed Care – PPO | Admitting: Gastroenterology

## 2019-02-09 VITALS — BP 120/84 | HR 93 | Temp 98.8°F | Ht 69.0 in | Wt 190.5 lb

## 2019-02-09 DIAGNOSIS — K625 Hemorrhage of anus and rectum: Secondary | ICD-10-CM

## 2019-02-09 DIAGNOSIS — K602 Anal fissure, unspecified: Secondary | ICD-10-CM

## 2019-02-09 MED ORDER — AMBULATORY NON FORMULARY MEDICATION: 1 refills | Status: AC

## 2019-02-09 NOTE — Addendum Note (Signed)
Addended by: Grace Bushy A on: 02/09/2019 04:25 PM   Modules accepted: Orders

## 2019-02-09 NOTE — Progress Notes (Signed)
[facility] Consult Note:  History: Timothy Patterson 02/09/2019  Referring provider: Rutherford Guys, MD  Reason for consult/chief complaint: Abdominal Pain (rectal pain with bleeding, light red and streaky)   Subjective  HPI:  Timothy Patterson sees me as a new patient today for rectal bleeding.  It has occurred intermittently since late 2018, sometimes with a little pain, always with bowel movements.  There may be a few drops of blood in the toilet and some on the paper.  His bowel habits are generally twice per day, though sometimes thin.  He admits that he sometimes strains but is not sure why.  He has some occasional brief lower abdominal bloating otherwise no chronic abdominal pain.  He denies nausea vomiting early satiety dysphagia or weight loss.  He also has some occasional scattered nonexertional bilateral fleeting chest pain that seems unrelated to meals, time of day or position.   ROS:  Review of Systems No dyspnea or dysuria. Depression  Past Medical History: Past Medical History:  Diagnosis Date  . ADHD (attention deficit hyperactivity disorder)    HX of ADHD  . Anxiety   . Severe recurrent depression with psychosis [facility])    hospitalized 07/2017     Past Surgical History: Past Surgical History:  Procedure Laterality Date  . ingrown toe nail       Family History: Family History  Problem Relation Age of Onset  . Depression Mother   . Anxiety disorder Mother   . Bipolar disorder Mother   . Depression Sister   . Anxiety disorder Sister   . Hyperlipidemia Paternal Grandfather   . Bipolar disorder Maternal Grandmother   . Depression Maternal Grandmother     Social History: Social History   Socioeconomic History  . Marital status: Single    Spouse name: Not on file  . Number of children: Not on file  . Years of education: Not on file  . Highest education level: Not on file  Occupational History  . Not on file  Social Needs  .  Financial resource strain: Not on file  . Food insecurity:    Worry: Not on file    Inability: Not on file  . Transportation needs:    Medical: Not on file    Non-medical: Not on file  Tobacco Use  . Smoking status: Current Some Day Smoker    Types: Cigarettes  . Smokeless tobacco: Never Used  . Tobacco comment: not often  Substance and Sexual Activity  . Alcohol use: Yes    Alcohol/week: 0.0 standard drinks    Comment: occasional  . Drug use: Yes    Frequency: 1.0 times per week    Types: Marijuana  . Sexual activity: Never  Lifestyle  . Physical activity:    Days per week: Not on file    Minutes per session: Not on file  . Stress: Not on file  Relationships  . Social connections:    Talks on phone: Not on file    Gets together: Not on file    Attends religious service: Not on file    Active member of club or organization: Not on file    Attends meetings of clubs or organizations: Not on file    Relationship status: Not on file  Other Topics Concern  . Not on file  Social History Narrative  . Not on file    Allergies: No Known Allergies  Outpatient Meds: No current outpatient medications on file.   No current facility-administered  medications for this visit.       ___________________________________________________________________ Objective   Exam:  BP 120/84 (BP Location: Left Arm, Patient Position: Sitting, Cuff Size: Normal)   Pulse 93   Temp 98.8 F (37.1 C) (Oral)   Ht 5\' 9"  (1.753 m)   Wt 190 lb 8 oz (86.4 kg)   BMI 28.13 kg/m    General: He is well-appearing, pleasant and conversational.  Exam chaperoned by our MA Ruthann Cancerottie Jones  Eyes: sclera anicteric, no redness  ENT: oral mucosa moist without lesions, no cervical or supraclavicular lymphadenopathy  CV: RRR without murmur, S1/S2, no JVD, no peripheral edema  Resp: clear to auscultation bilaterally, normal RR and effort noted  GI: soft, no tenderness, with active bowel sounds. No  guarding or palpable organomegaly noted.  Skin; warm and dry, no rash or jaundice noted  Neuro: awake, alert and oriented x 3. Normal gross motor function and fluent speech Rectal: Small tender posterior anal fissure, no palpable internal lesions. Anoscopy normal   Assessment: Encounter Diagnoses  Name Primary?  Marland Kitchen. Anal fissure Yes  . Anal bleeding     Small posterior anal fissure with remittent bleeding.  Plan:  Tablespoon Metamucil and water daily Combination therapy with nitroglycerin and lidocaine ointment 3 times daily Follow-up in 6 weeks  Thank you for the courtesy of this consult.  Please call me with any questions or concerns.  Charlie PitterHenry L Danis III  CC: Referring provider noted above

## 2019-02-09 NOTE — Patient Instructions (Addendum)
We have sent a prescription for nitroglycerin 0.125% gel to [facility]. You should apply a pea size amount to your rectum three times daily x 6-8 weeks.  [facility]'s information is below: Address: [address], [place], [place] [address]  [phone]  *Please DO NOT go directly from our office to pick up this medication! Give the pharmacy 1 day to process the prescription as this is compounded at takes time to make.  Take OTC Metamucil one tablespoon daily  Apply OTC Lidocaine/Rectiacre 3 times daily  Thank you for entrusting me with your care and choosing [facility].  Dr Loletha Carrow

## 2019-02-09 NOTE — Telephone Encounter (Signed)
Tried to call the patients phone and then his mothers, no answer on either and not able to leave a voicemail. Patient needs to be pre-screened for Covid 19 for in person office visit this afternoon.

## 2019-02-10 ENCOUNTER — Encounter: Payer: Self-pay | Admitting: Family Medicine

## 2019-02-10 ENCOUNTER — Other Ambulatory Visit: Payer: Self-pay

## 2019-02-10 ENCOUNTER — Ambulatory Visit (INDEPENDENT_AMBULATORY_CARE_PROVIDER_SITE_OTHER): Payer: BC Managed Care – PPO | Admitting: Family Medicine

## 2019-02-10 VITALS — BP 131/84 | HR 96 | Temp 98.2°F | Ht 69.0 in | Wt 190.0 lb

## 2019-02-10 DIAGNOSIS — F411 Generalized anxiety disorder: Secondary | ICD-10-CM

## 2019-02-10 DIAGNOSIS — K602 Anal fissure, unspecified: Secondary | ICD-10-CM

## 2019-02-10 DIAGNOSIS — F333 Major depressive disorder, recurrent, severe with psychotic symptoms: Secondary | ICD-10-CM | POA: Diagnosis not present

## 2019-02-10 NOTE — Progress Notes (Signed)
6/10/20203:17 PM  Timothy Patterson June 28, 1996, 23 y.o., male [id number]  Chief Complaint  Patient presents with  . Rectal Bleeding    went to gastro and fissure was found getting tx with nitroglycerin ointment    HPI:   Patient is a 23 y.o. male with past medical history significant for ADD, depression with psychosis and anxiety who presents today for followup  Last OV April Referred to GI - anal fissure - NTG Referred to psych - patient cancelled appt  Patient feeling better since seen GI Was able to get NTG wo any issues Taking miralax, not straining  He decided to go [facility] Has completed paperwork, pending appt Feels that mood overall stable, denies SI Has a new a job, looking forward to it, moving company Planing on going back to school, GTT  Wondering about lions mane supplement edible mushroom with possible neurogenesis properties  Fall Risk  02/10/2019 12/28/2018 03/31/2018 02/14/2018 10/07/2017  Falls in the past year? 0 0 No No No  Number falls in past yr: 0 0 - - -  Injury with Fall? 0 0 - - -     Depression screen [facility] 2/9 12/28/2018 08/11/2018 02/14/2018  Decreased Interest 0 1 1  Down, Depressed, Hopeless 0 2 -  PHQ - 2 Score 0 3 1  Altered sleeping - 3 -  Tired, decreased energy - 1 -  Change in appetite - 1 -  Feeling bad or failure about yourself  - 0 -  Trouble concentrating - 1 -  Moving slowly or fidgety/restless - 2 -  Suicidal thoughts - 0 -  PHQ-9 Score - 11 -  Difficult doing work/chores - Very difficult -    No Known Allergies  Prior to Admission medications   Medication Sig Start Date End Date Taking? Authorizing Provider  AMBULATORY NON FORMULARY MEDICATION Medication Name: NTG ointment 0.125% apply a pea size amount 3 times daily 02/09/19  Yes Danis, Andreas BlowerHenry L III, MD    Past Medical History:  Diagnosis Date  . ADHD (attention deficit hyperactivity disorder)    HX of ADHD  . Anxiety   . Severe recurrent  depression with psychosis [facility](HCC)    hospitalized 07/2017    Past Surgical History:  Procedure Laterality Date  . ingrown toe nail      Social History   Tobacco Use  . Smoking status: Current Some Day Smoker    Types: Cigarettes  . Smokeless tobacco: Never Used  . Tobacco comment: not often  Substance Use Topics  . Alcohol use: Yes    Alcohol/week: 0.0 standard drinks    Comment: occasional    Family History  Problem Relation Age of Onset  . Depression Mother   . Anxiety disorder Mother   . Bipolar disorder Mother   . Depression Sister   . Anxiety disorder Sister   . Hyperlipidemia Paternal Grandfather   . Bipolar disorder Maternal Grandmother   . Depression Maternal Grandmother     ROS Per hpi  OBJECTIVE:  Today's Vitals   02/10/19 1449  BP: 131/84  Pulse: 96  Temp: 98.2 F (36.8 C)  TempSrc: Oral  SpO2: 96%  Weight: 190 lb (86.2 kg)  Height: 5\' 9"  (1.753 m)   Body mass index is 28.06 kg/m.   Physical Exam Vitals signs and nursing note reviewed.  Constitutional:      Appearance: He is well-developed.  HENT:     Head: Normocephalic and atraumatic.  Eyes:  Conjunctiva/sclera: Conjunctivae normal.     Pupils: Pupils are equal, round, and reactive to light.  Neck:     Musculoskeletal: Neck supple.  Pulmonary:     Effort: Pulmonary effort is normal.  Skin:    General: Skin is warm and dry.  Neurological:     Mental Status: He is alert and oriented to person, place, and time.    ASSESSMENT and PLAN  1. Anal fissure Managed by gi. Cont with med and LFM. followup as scheduled  2. Severe episode of recurrent major depressive disorder, with psychotic features ([place]) 3. GAD (generalized anxiety disorder) Stable. Pending establishing care with [facility].  Return in about 6 months (around 08/12/2019).    Rutherford Guys, MD Primary Care at [place], [place] [address] Ph.  [phone] Fax  [phone]

## 2019-02-10 NOTE — Patient Instructions (Signed)
     If you have lab work done today you will be contacted with your lab results within the next 2 weeks.  If you have not heard from us then please contact us. The fastest way to get your results is to register for My Chart.   IF you received an x-ray today, you will receive an invoice from [facility]. Please contact [facility] at [phone] with questions or concerns regarding your invoice.   IF you received labwork today, you will receive an invoice from [facility]. Please contact [facility] at [phone] with questions or concerns regarding your invoice.   Our billing staff will not be able to assist you with questions regarding bills from these companies.  You will be contacted with the lab results as soon as they are available. The fastest way to get your results is to activate your My Chart account. Instructions are located on the last page of this paperwork. If you have not heard from us regarding the results in 2 weeks, please contact this office.        If you have lab work done today you will be contacted with your lab results within the next 2 weeks.  If you have not heard from us then please contact us. The fastest way to get your results is to register for My Chart.   IF you received an x-ray today, you will receive an invoice from [facility]. Please contact  Radiology at [phone] with questions or concerns regarding your invoice.   IF you received labwork today, you will receive an invoice from [facility]. Please contact [facility] at [phone] with questions or concerns regarding your invoice.   Our billing staff will not be able to assist you with questions regarding bills from these companies.  You will be contacted with the lab results as soon as they are available. The fastest way to get your results is to activate your My Chart account. Instructions are located on the last page of this paperwork. If you have not heard from us  regarding the results in 2 weeks, please contact this office.     

## 2019-03-22 ENCOUNTER — Telehealth: Payer: Self-pay

## 2019-03-22 NOTE — Telephone Encounter (Signed)
Covid-19 screening questions   Do you now or have you had a fever in the last 14 days?  Do you have any respiratory symptoms of shortness of breath or cough now or in the last 14 days?  Do you have any family members or close contacts with diagnosed or suspected Covid-19 in the past 14 days?  Have you been tested for Covid-19 and found to be positive?       

## 2019-03-23 ENCOUNTER — Ambulatory Visit (INDEPENDENT_AMBULATORY_CARE_PROVIDER_SITE_OTHER): Payer: BC Managed Care – PPO | Admitting: Gastroenterology

## 2019-03-23 ENCOUNTER — Telehealth: Payer: Self-pay

## 2019-03-23 ENCOUNTER — Encounter: Payer: Self-pay | Admitting: Gastroenterology

## 2019-03-23 VITALS — BP 122/78 | HR 72 | Temp 98.2°F | Wt 204.0 lb

## 2019-03-23 DIAGNOSIS — K5909 Other constipation: Secondary | ICD-10-CM | POA: Diagnosis not present

## 2019-03-23 DIAGNOSIS — K625 Hemorrhage of anus and rectum: Secondary | ICD-10-CM | POA: Diagnosis not present

## 2019-03-23 DIAGNOSIS — K602 Anal fissure, unspecified: Secondary | ICD-10-CM | POA: Diagnosis not present

## 2019-03-23 NOTE — Telephone Encounter (Signed)
Covid-19 screening questions   Do you now or have you had a fever in the last 14 days no   Do you have any respiratory symptoms of shortness of breath or cough now or in the last 14 days no  Do you have any family members or close contacts with diagnosed or suspected Covid-19 in the past 14 days no  Have you been tested for Covid-19 and found to be positive no          

## 2019-03-23 NOTE — Progress Notes (Signed)
     [facility] Progress Note  Chief Complaint: Anal fissure  Subjective  History:  Merrilee Seashore has been feeling better since I last saw him.  Only one episode of bleeding, pain has resolved.  Bowel movements sometimes thin with the need to strain, and he had not started the fiber supplement.  Sometimes crampy lower abdominal pain when he needs to have a BM.  Appetite good and weight stable.  He has been using the nitroglycerin ointment regularly, and has not needed the Recticare for the last couple of weeks.  ROS: Cardiovascular:  no chest pain Respiratory: no dyspnea  The patient's Past Medical, Family and Social History were reviewed and are on file in the EMR.  Objective:  Med list reviewed  Current Outpatient Medications:  .  AMBULATORY NON FORMULARY MEDICATION, Medication Name: NTG ointment 0.125% apply a pea size amount 3 times daily, Disp: 30 g, Rfl: 1   Vital signs in last 24 hrs: Vitals:   03/23/19 1330  BP: 122/78  Pulse: 72  Temp: 98.2 F (36.8 C)  SpO2: 97%    Physical Exam     Cardiac: RRR without murmurs, S1S2 heard, no peripheral edema  Pulm: clear to auscultation bilaterally, normal RR and effort noted  Abdomen: soft, no tenderness, with active bowel sounds. No guarding or palpable hepatosplenomegaly.  Rectal: Anal fissure has healed.  Posterior anal scar tissue felt - no tenderness, no palpable internal lesions.    @ASSESSMENTPLANBEGIN @ Assessment: Encounter Diagnoses  Name Primary?  Marland Kitchen Anal fissure Yes  . Anal bleeding   . Chronic constipation    Fissure has healed.  He can use the ointments as needed for several days if symptoms recur.  Encouraged daily Citrucel use for regularity in order to relieve lower abdominal discomfort and hopefully keep fissure from recurring.  See me as needed.   Total time 15 minutes, over half spent face-to-face with patient in counseling and coordination of care.   Nelida Meuse III

## 2019-03-23 NOTE — Patient Instructions (Addendum)
If you are age 21 or older, your body mass index should be between 23-30. Your Body mass index is 30.13 kg/m. If this is out of the aforementioned range listed, please consider follow up with your Primary Care Provider.  If you are age 46 or younger, your body mass index should be between 19-25. Your Body mass index is 30.13 kg/m. If this is out of the aformentioned range listed, please consider follow up with your Primary Care Provider.   It was a pleasure to see you today!  Dr. Loletha Carrow   One tablespoon of generic Citrucel fiber in a large glass of water every day.  Use the nitroglycerin and recticare ointment for several days as needed for recurrent anal pain or bleeding.  Give [place] a call if you need.  - Dr. Loletha Carrow

## 2019-03-29 ENCOUNTER — Encounter: Payer: Self-pay | Admitting: Family Medicine

## 2019-04-05 ENCOUNTER — Ambulatory Visit (INDEPENDENT_AMBULATORY_CARE_PROVIDER_SITE_OTHER): Payer: BC Managed Care – PPO | Admitting: Family Medicine

## 2019-04-05 ENCOUNTER — Other Ambulatory Visit: Payer: Self-pay

## 2019-04-05 DIAGNOSIS — Z23 Encounter for immunization: Secondary | ICD-10-CM | POA: Diagnosis not present

## 2019-04-09 ENCOUNTER — Encounter: Payer: Self-pay | Admitting: Family Medicine

## 2019-04-09 ENCOUNTER — Telehealth (INDEPENDENT_AMBULATORY_CARE_PROVIDER_SITE_OTHER): Payer: BC Managed Care – PPO | Admitting: Family Medicine

## 2019-04-09 ENCOUNTER — Other Ambulatory Visit: Payer: Self-pay

## 2019-04-09 VITALS — Ht 68.0 in | Wt 200.0 lb

## 2019-04-09 DIAGNOSIS — J387 Other diseases of larynx: Secondary | ICD-10-CM | POA: Diagnosis not present

## 2019-04-09 DIAGNOSIS — B370 Candidal stomatitis: Secondary | ICD-10-CM

## 2019-04-09 MED ORDER — NYSTATIN 100000 UNIT/ML MT SUSP: 5.0000 mL | Freq: Four times a day (QID) | OROMUCOSAL | 0 refills | Status: DC

## 2019-04-09 NOTE — Progress Notes (Signed)
   Virtual Visit Note  I connected with patient on 04/09/19 at 541pm by video doxymity and verified that I am speaking with the correct person using two identifiers. Timothy Patterson is currently located at home and patient is currently with them during visit. The provider, Rutherford Guys, MD is located in their office at time of visit.  I discussed the limitations, risks, security and privacy concerns of performing an evaluation and management service by telephone and the availability of in person appointments. I also discussed with the patient that there may be a patient responsible charge related to this service. The patient expressed understanding and agreed to proceed.   CC: sore in throat  HPI ? Has had recurrent open sore on right side of throat, resolves after about a week but then will come back Associated with white covering of his tongue Associated with not brushing his teeth as often as he should Painful intermittent sharp pain when he swallows No swollen glands No fever, some chills No SOB or cough smokes  No Known Allergies  Prior to Admission medications   Medication Sig Start Date End Date Taking? Authorizing Provider  AMBULATORY NON FORMULARY MEDICATION Medication Name: NTG ointment 0.125% apply a pea size amount 3 times daily 02/09/19  Yes Danis, Kirke Corin, MD    Past Medical History:  Diagnosis Date  . ADHD (attention deficit hyperactivity disorder)    HX of ADHD  . Anxiety   . Severe recurrent depression with psychosis [facility])    hospitalized 07/2017    Past Surgical History:  Procedure Laterality Date  . ingrown toe nail      Social History   Tobacco Use  . Smoking status: Current Some Day Smoker    Types: Cigarettes  . Smokeless tobacco: Never Used  . Tobacco comment: not often  Substance Use Topics  . Alcohol use: Yes    Alcohol/week: 0.0 standard drinks    Comment: occasional    Family History  Problem Relation Age of Onset  .  Depression Mother   . Anxiety disorder Mother   . Bipolar disorder Mother   . Depression Sister   . Anxiety disorder Sister   . Hyperlipidemia Paternal Grandfather   . Bipolar disorder Maternal Grandmother   . Depression Maternal Grandmother     ROS Per hpi  Objective  Vitals as reported by the patient: none  Unable to see OP via video call  ASSESSMENT and PLAN  1. Throat ulcer 2. Oral thrush Discussed supportive measures, new meds r/se/b and RTC precautions. If it returns needs ENT referral.  Other orders - nystatin (MYCOSTATIN) 100000 UNIT/ML suspension; Take 5 mLs (500,000 Units total) by mouth 4 (four) times daily. Swish and swallow  FOLLOW-UP: prn   The above assessment and management plan was discussed with the patient. The patient verbalized understanding of and has agreed to the management plan. Patient is aware to call the clinic if symptoms persist or worsen. Patient is aware when to return to the clinic for a follow-up visit. Patient educated on when it is appropriate to go to the emergency department.    I provided 10 minutes of non-face-to-face time during this encounter.  Rutherford Guys, MD Primary Care at [place], [place] [address] Ph.  [phone] Fax [phone]

## 2019-04-09 NOTE — Progress Notes (Signed)
Sore Throat started in the week not even 10 days and getting. Open soar at back of throat.   Tried: salt water rinse, got back on oral hygiene

## 2019-04-09 NOTE — Patient Instructions (Signed)
° ° ° °  If you have lab work done today you will be contacted with your lab results within the next 2 weeks.  If you have not heard from us then please contact us. The fastest way to get your results is to register for My Chart. ° ° °IF you received an x-ray today, you will receive an invoice from [facility]. Please contact [facility] at [phone] with questions or concerns regarding your invoice.  ° °IF you received labwork today, you will receive an invoice from [facility]. Please contact [facility] at [phone] with questions or concerns regarding your invoice.  ° °Our billing staff will not be able to assist you with questions regarding bills from these companies. ° °You will be contacted with the lab results as soon as they are available. The fastest way to get your results is to activate your My Chart account. Instructions are located on the last page of this paperwork. If you have not heard from us regarding the results in 2 weeks, please contact this office. °  ° ° ° °

## 2019-04-12 ENCOUNTER — Encounter: Payer: Self-pay | Admitting: Family Medicine

## 2019-04-13 ENCOUNTER — Other Ambulatory Visit: Payer: Self-pay

## 2019-04-13 DIAGNOSIS — B370 Candidal stomatitis: Secondary | ICD-10-CM

## 2019-04-13 DIAGNOSIS — J387 Other diseases of larynx: Secondary | ICD-10-CM

## 2019-04-22 ENCOUNTER — Encounter: Payer: Self-pay | Admitting: Family Medicine

## 2019-08-13 ENCOUNTER — Ambulatory Visit: Payer: BC Managed Care – PPO | Admitting: Family Medicine

## 2019-09-06 ENCOUNTER — Other Ambulatory Visit: Payer: Self-pay | Admitting: Family Medicine

## 2019-10-20 ENCOUNTER — Other Ambulatory Visit: Payer: Self-pay

## 2019-10-20 ENCOUNTER — Telehealth (INDEPENDENT_AMBULATORY_CARE_PROVIDER_SITE_OTHER): Payer: BC Managed Care – PPO | Admitting: Adult Health Nurse Practitioner

## 2019-10-20 VITALS — Ht 68.0 in | Wt 215.0 lb

## 2019-10-20 DIAGNOSIS — K582 Mixed irritable bowel syndrome: Secondary | ICD-10-CM | POA: Diagnosis not present

## 2019-10-26 ENCOUNTER — Other Ambulatory Visit (HOSPITAL_COMMUNITY)
Admission: RE | Admit: 2019-10-26 | Discharge: 2019-10-26 | Disposition: A | Discharge status: Home / Self Care | Payer: BC Managed Care – PPO | Source: Ambulatory Visit | Attending: Family Medicine | Admitting: Family Medicine

## 2019-10-26 ENCOUNTER — Ambulatory Visit: Payer: BC Managed Care – PPO | Admitting: Family Medicine

## 2019-10-26 ENCOUNTER — Encounter: Payer: Self-pay | Admitting: Family Medicine

## 2019-10-26 ENCOUNTER — Other Ambulatory Visit: Payer: Self-pay

## 2019-10-26 VITALS — BP 122/80 | HR 87 | Temp 98.7°F | Ht 68.0 in | Wt 212.0 lb

## 2019-10-26 DIAGNOSIS — R1032 Left lower quadrant pain: Secondary | ICD-10-CM | POA: Diagnosis not present

## 2019-10-26 DIAGNOSIS — K5904 Chronic idiopathic constipation: Secondary | ICD-10-CM

## 2019-10-26 DIAGNOSIS — Z202 Contact with and (suspected) exposure to infections with a predominantly sexual mode of transmission: Secondary | ICD-10-CM

## 2019-10-26 LAB — POCT CBC
Granulocyte percent: 53.8 %G (ref 37–80)
HCT, POC: 47 % — AB (ref 29–41)
Hemoglobin: 16.1 g/dL — AB (ref 11–14.6)
Lymph, poc: 3.2 (ref 0.6–3.4)
MCH, POC: 29.8 pg (ref 27–31.2)
MCHC: 34.1 g/dL (ref 31.8–35.4)
MCV: 87 fL (ref 76–111)
MID (cbc): 0.4 (ref 0–0.9)
MPV: 7.5 fL (ref 0–99.8)
POC Granulocyte: 4.3 (ref 2–6.9)
POC LYMPH PERCENT: 40.6 %L (ref 10–50)
POC MID %: 5.6 %M (ref 0–12)
Platelet Count, POC: 262 10*3/uL (ref 142–424)
RBC: 5.41 M/uL (ref 4.69–6.13)
RDW, POC: 13.5 %
WBC: 8 10*3/uL (ref 4.6–10.2)

## 2019-10-26 NOTE — Progress Notes (Signed)
Patient ID: Timothy Patterson, male    DOB: 10-Oct-1995  Age: 24 y.o. MRN: [id number]  Chief Complaint  Patient presents with  . 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  Dr. Pamella Pert told her the last summer that he might have a very small 1.  He has been reading up on things and is also quite concerned about lymph nodes.  He has occasional little discomfort in his neck and axillary regions and is worried about lymphatic diseases developing.  He has not been having a lot of left groin pain, but feels a little strain there.  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  It is not greatly impaired his work.  He also has concerns about STIs, and would like to be tested.  He has had  bisexual relations.  No anal intercourse though he has a history of an anal fissure.  He uses a little of an old prescription of nitroglycerin gel for that.  He does have a gastroenterologist that he is seen in the past for that.  Current allergies, medications, problem list, past/family and social histories reviewed.  Objective:  BP 122/80   Pulse 87   Temp 98.7 F (37.1 C)   Ht 5\' 8"  (1.727 m)   Wt 212 lb (96.2 kg)   SpO2 97%   BMI 32.23 kg/m   No major acute distress.  Healthy looking young man.  No axillary or cervical nodes could be palpated.  Abdomen soft without hepatosplenomegaly.  Normal male external genitalia with testes descended.  I can feel a tiny less than 1 cm node in the left groin region.  I cannot define a hernia.  I do not believe that is a hernia, rather a small lymph node.  We discussed hernias.  Assessment & Plan:   Assessment: 1. Possible exposure to STD   2. Left inguinal pain   3. Chronic idiopathic constipation       Plan: Check a CBC and STD testing on him.  Orders Placed This Encounter   Procedures  . RPR  . HIV Antibody (routine testing w rflx)  . POCT CBC    No orders of the defined types were placed in this encounter.   Results for orders placed or performed in visit on 10/26/19  POCT CBC  Result Value Ref Range   WBC 8.0 4.6 - 10.2 K/uL   Lymph, poc 3.2 0.6 - 3.4   POC LYMPH PERCENT 40.6 10 - 50 %L   MID (cbc) 0.4 0 - 0.9   POC MID % 5.6 0 - 12 %M   POC Granulocyte 4.3 2 - 6.9   Granulocyte percent 53.8 37 - 80 %G   RBC 5.41 4.69 - 6.13 M/uL   Hemoglobin 16.1 (A) 11 - 14.6 g/dL   HCT, POC 47.0 (A) 29 - 41 %   MCV 87.0 76 - 111 fL   MCH, POC 29.8 27 - 31.2 pg   MCHC 34.1 31.8 - 35.4 g/dL   RDW, POC 13.5 %   Platelet Count, POC 262 142 - 424 K/uL   MPV 7.5 0 - 99.8 fL        Patient Instructions       If you have lab work done today  you will be contacted with your lab results within the next 2 weeks.  If you have not heard from [place] then please contact us. The fastest way to get your results is to register for My Chart.   IF you received an x-ray today, you will receive an invoice from [facility]. Please contact [facility] at [phone] with questions or concerns regarding your invoice.   IF you received labwork today, you will receive an invoice from [facility]. Please contact [facility] at [phone] with questions or concerns regarding your invoice.   Our billing staff will not be able to assist you with questions regarding bills from these companies.  You will be contacted with the lab results as soon as they are available. The fastest way to get your results is to activate your My Chart account. Instructions are located on the last page of this paperwork. If you have not heard from [place] regarding the results in 2 weeks, please contact this office.        No follow-ups on file.   Janace Hoard, MD 10/26/2019

## 2019-10-26 NOTE — Patient Instructions (Addendum)
I do not believe you have a significant hernia if any hernia.  However if it keeps on giving you problems in the area we can send you to a general surgeon let them look at you.  I do not think that is necessary at this time, and even if you have a tiny hernia they were not going to repair it at this point.  There is no evidence for lymphatic disease on you that I can detect.  I think this is primarily anxiety because you to worry about these items.  Your blood count looks fine.  Sexual activity carries significant risks with it.  If you choose to be sexually involved, you need to use protection 100% of the time.  Even if you use protection, you can catch diseases so you need to get checked periodically.  The only safe sex is long-term (lifelong) monogamous sex.  Return if further concerns or if more problems from possible hernia recurs.    If you have lab work done today you will be contacted with your lab results within the next 2 weeks.  If you have not heard from [place] then please contact us. The fastest way to get your results is to register for My Chart.   IF you received an x-ray today, you will receive an invoice from [facility]. Please contact [facility] at [phone] with questions or concerns regarding your invoice.   IF you received labwork today, you will receive an invoice from [facility]. Please contact [facility] at [phone] with questions or concerns regarding your invoice.   Our billing staff will not be able to assist you with questions regarding bills from these companies.  You will be contacted with the lab results as soon as they are available. The fastest way to get your results is to activate your My Chart account. Instructions are located on the last page of this paperwork. If you have not heard from [place] regarding the results in 2 weeks, please contact this office.

## 2019-10-27 LAB — RPR: RPR Ser Ql: NONREACTIVE

## 2019-10-27 LAB — HIV ANTIBODY (ROUTINE TESTING W REFLEX): HIV Screen 4th Generation wRfx: NONREACTIVE

## 2019-10-28 LAB — URINE CYTOLOGY ANCILLARY ONLY
Chlamydia: NEGATIVE
Comment: NEGATIVE
Comment: NORMAL
Neisseria Gonorrhea: NEGATIVE

## 2019-10-29 NOTE — Progress Notes (Signed)
Called pt and informed him of his normal lab results

## 2019-11-03 ENCOUNTER — Encounter: Payer: Self-pay | Admitting: Family Medicine

## 2019-11-03 DIAGNOSIS — F1010 Alcohol abuse, uncomplicated: Secondary | ICD-10-CM

## 2019-11-03 DIAGNOSIS — F411 Generalized anxiety disorder: Secondary | ICD-10-CM

## 2019-11-03 DIAGNOSIS — F333 Major depressive disorder, recurrent, severe with psychotic symptoms: Secondary | ICD-10-CM

## 2019-11-03 DIAGNOSIS — F1220 Cannabis dependence, uncomplicated: Secondary | ICD-10-CM

## 2019-11-03 NOTE — Telephone Encounter (Signed)
Please Advise

## 2019-11-04 NOTE — Telephone Encounter (Signed)
Contact information for referral for patient.

## 2019-11-10 IMAGING — DX DG ABDOMEN 2V
2 series · 2 of 2 positions shown · non-contrast
Comparison: None.

CLINICAL DATA: 21-year-old male with a history of constipation

EXAM:
ABDOMEN - 2 VIEW

[abdomen erect]
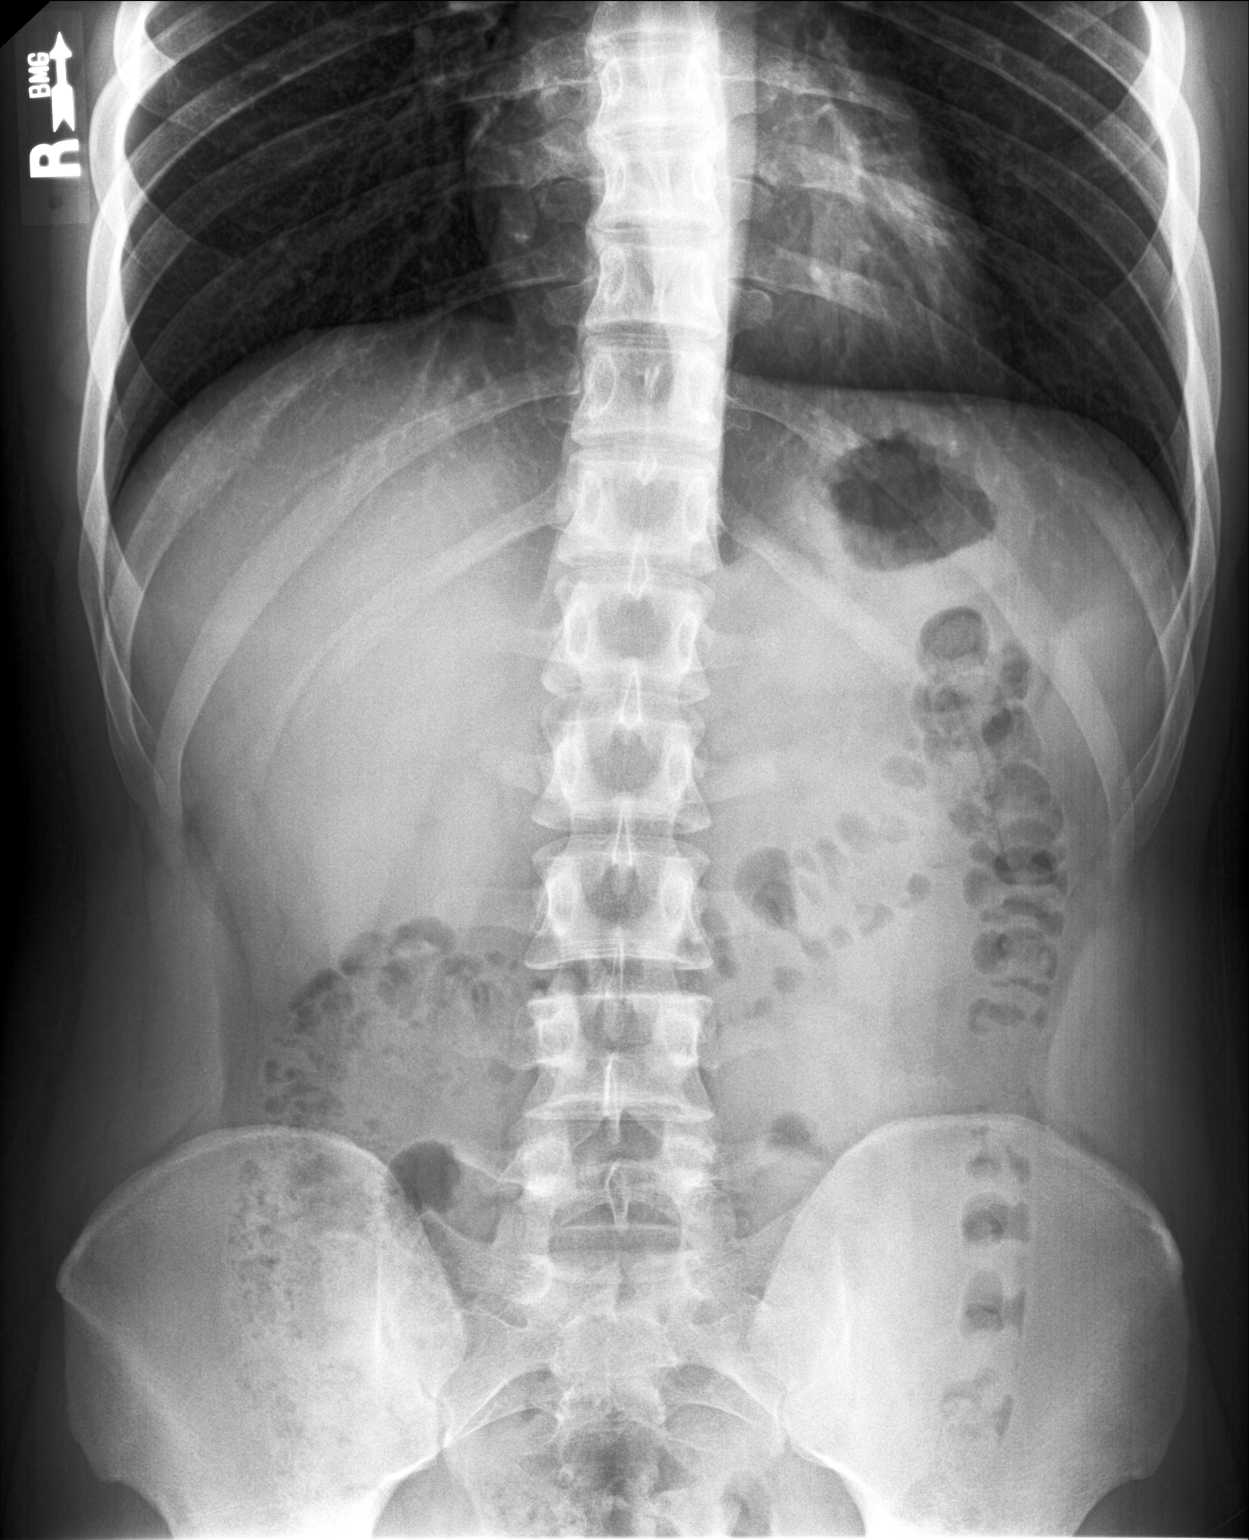

[abdomen supine]
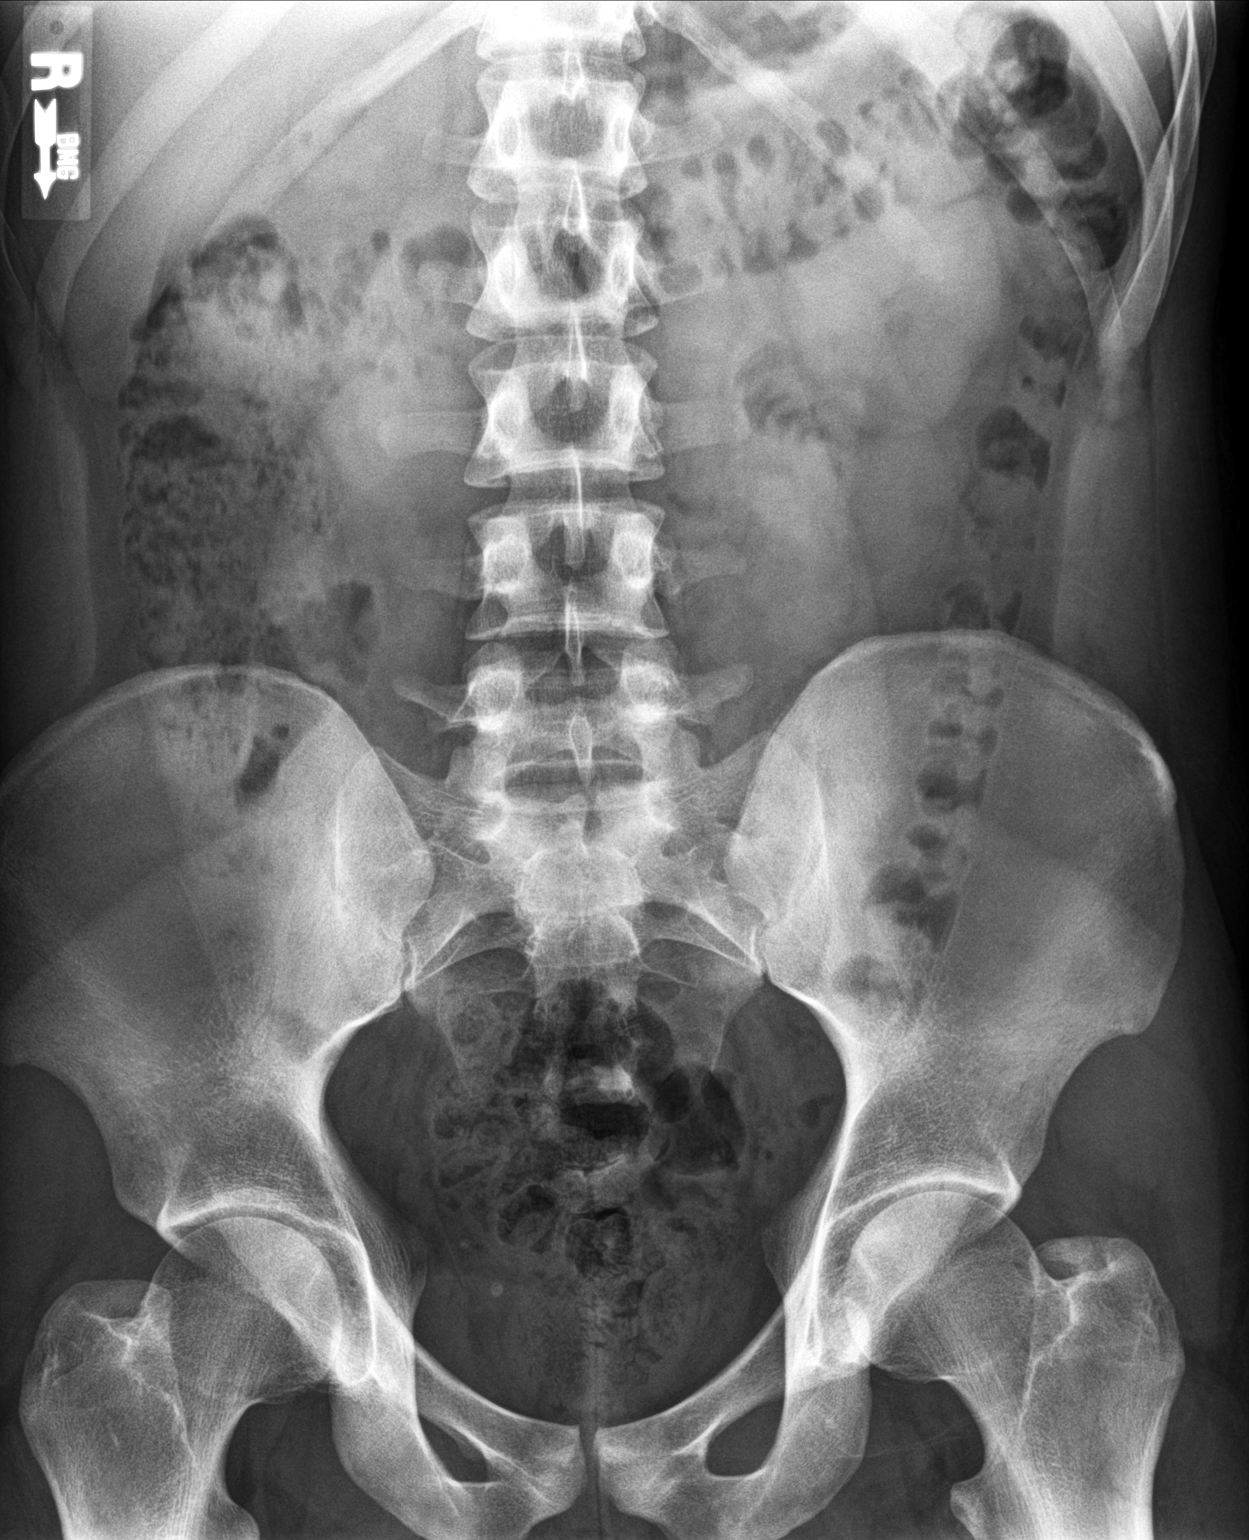

[2 of 2 positions shown; findings below may reference images not displayed]

FINDINGS: The bowel gas pattern is normal. There is no evidence of free air.
No radio-opaque calculi or other significant radiographic
abnormality is seen. Moderate stool burden of the right colon,
rectum, transverse colon.
IMPRESSION: Nonobstructive bowel gas pattern.

Moderate stool burden.

## 2019-11-19 ENCOUNTER — Encounter: Payer: Self-pay | Admitting: Adult Health Nurse Practitioner

## 2019-11-19 DIAGNOSIS — K582 Mixed irritable bowel syndrome: Secondary | ICD-10-CM | POA: Insufficient documentation

## 2019-11-19 NOTE — Progress Notes (Signed)
Virtual Visit via Video Note  I connected with Timothy Patterson on 11/19/19 at  3:50 PM EST by a video enabled telemedicine application and verified that I am speaking with the correct person using two identifiers.  Location: Patient: in car  Provider: [place]    I discussed the limitations of evaluation and management by telemedicine and the availability of in person appointments. The patient expressed understanding and agreed to proceed.  History of Present Illness:  Patient presents with discussion of abdominal pain x 1 year.  It is waxing and waning.  Some rectal bleeding.  Hx of anal fissure in the past.  He is bisexual, has had anal penetration before.  Concerned about pain to this area. BM sometimes very hard, then watery in an intermittent changing pattern.  Diet is okay and pain is not present all of the time.  He denies chills, fevers, night sweats.  No pain at the moment.  Thinks he may have hemorrhoids but unsure   Observations/Objective:  General appearance: alert, well appearing, and in no distress. Mental Status: normal mood, behavior, speech, dress, motor activity, and thought processes.  Assessment and Plan: 1. Irritable bowel syndrome with both constipation and diarrhea      Follow Up Instructions:   Will consider GI referral.  Would like to see the patient in the office for full exam.  He verbalized understanding.  I discussed the assessment and treatment plan with the patient. The patient was provided an opportunity to ask questions and all were answered. The patient agreed with the plan and demonstrated an understanding of the instructions.   The patient was advised to call back or seek an in-person evaluation if the symptoms worsen or if the condition fails to improve as anticipated.  I provided 17  minutes of non-face-to-face time during this encounter.   Elyse Jarvis, NP

## 2019-11-30 ENCOUNTER — Encounter: Payer: Self-pay | Admitting: Adult Health Nurse Practitioner

## 2022-12-21 ENCOUNTER — Emergency Department (HOSPITAL_COMMUNITY)
Admission: EM | Admit: 2022-12-21 | Discharge: 2022-12-21 | Disposition: A | Discharge status: Home / Self Care | Payer: BC Managed Care – PPO | Attending: Emergency Medicine | Admitting: Emergency Medicine

## 2022-12-21 ENCOUNTER — Encounter (HOSPITAL_COMMUNITY): Payer: Self-pay

## 2022-12-21 DIAGNOSIS — F10920 Alcohol use, unspecified with intoxication, uncomplicated: Secondary | ICD-10-CM | POA: Insufficient documentation

## 2022-12-21 DIAGNOSIS — R112 Nausea with vomiting, unspecified: Secondary | ICD-10-CM

## 2022-12-21 MED ORDER — FAMOTIDINE IN NACL 20-0.9 MG/50ML-% IV SOLN
20.0000 mg | INTRAVENOUS | Status: AC
  Administered 2022-12-21: 20 mg via INTRAVENOUS
  Filled 2022-12-21: qty 50

## 2022-12-21 MED ORDER — ONDANSETRON 4 MG PO TBDP: 4.0000 mg | ORAL_TABLET | Freq: Three times a day (TID) | ORAL | 0 refills | Status: AC | PRN

## 2022-12-21 MED ORDER — SODIUM CHLORIDE 0.9 % IV BOLUS
1000.0000 mL | Freq: Once | INTRAVENOUS | Status: AC
  Administered 2022-12-21: 1000 mL via INTRAVENOUS

## 2022-12-21 MED ORDER — ONDANSETRON HCL 4 MG/2ML IJ SOLN
4.0000 mg | Freq: Once | INTRAMUSCULAR | Status: AC
  Administered 2022-12-21: 4 mg via INTRAVENOUS
  Filled 2022-12-21: qty 2

## 2022-12-21 NOTE — ED Triage Notes (Signed)
Pt presents to ED for evaluation of N/V related to alcohol intoxication tonight.

## 2022-12-21 NOTE — ED Provider Notes (Signed)
[place] [facility]GENCY DEPARTMENT AT [facility] Provider Note   CSN: [id number] Arrival date & time: 12/21/22  6010     History  Chief Complaint  Patient presents with   Alcohol Intoxication    Timothy Patterson is a 27 y.o. male.  27 year old male presents to the emergency department for nausea and vomiting.  Reports that he consumed an excessive amount of alcohol this evening.  He was found by police outside of his vehicle which was pulled over on the side of the road. He had apparently stopped the vehicle to get out and throw up, but the car continued to roll while the patient was outside the vehicle. Campus police noticed the patient and then notified [employer]. Denies pain as well as any other co-ingestion. Given IVF by EMS during transport.  The history is provided by the police and the patient. No language interpreter was used.  Alcohol Intoxication       Home Medications Prior to Admission medications   Medication Sig Start Date End Date Taking? Authorizing Provider  ondansetron (ZOFRAN-ODT) 4 MG disintegrating tablet Take 1 tablet (4 mg total) by mouth every 8 (eight) hours as needed for nausea or vomiting. 12/21/22  Yes Antony Madura, PA-C  AMBULATORY NON FORMULARY MEDICATION Medication Name: NTG ointment 0.125% apply a pea size amount 3 times daily 02/09/19   Sherrilyn Rist, MD      Allergies    Patient has no known allergies.    Review of Systems   Review of Systems Ten systems reviewed and are negative for acute change, except as noted in the HPI.    Physical Exam Updated Vital Signs BP 115/66   Pulse 79   Temp 97.6 F (36.4 C) (Oral)   Resp 17   Ht 5\' 6"  (1.676 m)   Wt 95.3 kg   SpO2 100%   BMI 33.89 kg/m   Physical Exam Vitals and nursing note reviewed.  Constitutional:      General: He is not in acute distress.    Appearance: He is well-developed. He is not diaphoretic.     Comments: Disheveled appearing. Debris on face and in hair.  Old vomit smeared on chin.  HENT:     Head: Normocephalic and atraumatic.     Comments: Atraumatic.  No Battle sign or raccoon's eyes.  No skull instability. Eyes:     General: No scleral icterus.    Conjunctiva/sclera: Conjunctivae normal.  Neck:     Comments: Normal range of motion without bony deformities, step-offs, crepitus to the cervical midline Cardiovascular:     Rate and Rhythm: Normal rate and regular rhythm.     Pulses: Normal pulses.  Pulmonary:     Effort: Pulmonary effort is normal. No respiratory distress.     Comments: Respirations even and unlabored Musculoskeletal:        General: Normal range of motion.     Cervical back: Normal range of motion.  Skin:    General: Skin is warm and dry.     Coloration: Skin is not pale.     Findings: No erythema or rash.  Neurological:     Mental Status: He is alert and oriented to person, place, and time.     Coordination: Coordination normal.     Comments: GCS 15. Speech is clear, goal oriented. Moving all extremities spontaneously.  Psychiatric:        Behavior: Behavior normal.     ED Results / Procedures / Treatments  Labs (all labs ordered are listed, but only abnormal results are displayed) Labs Reviewed - No data to display  EKG None  Radiology No results found.  Procedures Procedures    Medications Ordered in ED Medications  sodium chloride 0.9 % bolus 1,000 mL (0 mLs Intravenous Stopped 12/21/22 0532)  ondansetron (ZOFRAN) injection 4 mg (4 mg Intravenous Given 12/21/22 0407)  famotidine (PEPCID) IVPB 20 mg premix (0 mg Intravenous Stopped 12/21/22 0437)    ED Course/ Medical Decision Making/ A&P Clinical Course as of 12/21/22 0626  Sat Dec 21, 2022  0530 Patient reassessed. States he feels "bad" related to his drinking, but denies any pain. Nausea improved with medications.  Reports that he was recently changed to a new psychiatric medication. He was feeling optimistic about this and decided to go  out and let loose. Does not recall driving his vehicle from the bar to where he was found. [KH]    Clinical Course User Index [KH] Antony Madura, PA-C                             Medical Decision Making Risk Prescription drug management.   This patient presents to the ED for concern of N/V, this involves an extensive number of treatment options, and is a complaint that carries with it a high risk of complications and morbidity.  The differential diagnosis includes ETOH intoxication vs viral illness vs PUD/gastritis vs gastroparesis   Co morbidities that complicate the patient evaluation  ADHD Depression    Additional history obtained:  Additional history obtained from [facility]   Cardiac Monitoring:  The patient was maintained on a cardiac monitor.  I personally viewed and interpreted the cardiac monitored which showed an underlying rhythm of: NSR   Medicines ordered and prescription drug management:  I ordered medication including Pepcid, Zofran for nausea  Reevaluation of the patient after these medicines showed that the patient improved I have reviewed the patients home medicines and have made adjustments as needed   Test Considered:  Ethanol UDS   Problem List / ED Course:  Patient presenting with police. Noted to have slurred speech, vomiting in emesis bag. No c/o pain, headache.  Vomiting improved on reassessment following antiemetics. Slurred speech has improved as well and is now more clear. Patient continues to deny pain on recheck. Plan to PO trial and ambulate. Patient stable for discharge following successful ambulation trial. Tolerating PO fluids.    Reevaluation:  After the interventions noted above, I reevaluated the patient and found that they have :improved   Social Determinants of Health:  ETOH use disorder   Dispostion:  After consideration of the diagnostic results and the patients response to treatment, I feel that the patent would benefit  from discharge with Rx for antiemetics for PRN use. Return precautions discussed and provided. Patient discharged in stable condition with no unaddressed concerns.          Final Clinical Impression(s) / ED Diagnoses Final diagnoses:  Alcoholic intoxication without complication  Nausea and vomiting, unspecified vomiting type    Rx / DC Orders ED Discharge Orders          Ordered    ondansetron (ZOFRAN-ODT) 4 MG disintegrating tablet  Every 8 hours PRN        12/21/22 0621              Antony Madura, PA-C 01/02/23 2324    Horton, Mayer Masker, MD 01/04/23 [id number]

## 2023-01-30 ENCOUNTER — Encounter (HOSPITAL_COMMUNITY): Payer: Self-pay | Admitting: Emergency Medicine

## 2023-01-30 ENCOUNTER — Ambulatory Visit (HOSPITAL_COMMUNITY)
Admission: EM | Admit: 2023-01-30 | Discharge: 2023-01-30 | Disposition: A | Discharge status: Home / Self Care | Payer: BC Managed Care – PPO

## 2023-01-30 DIAGNOSIS — L923 Foreign body granuloma of the skin and subcutaneous tissue: Secondary | ICD-10-CM

## 2023-01-30 HISTORY — DX: Essential (primary) hypertension: I10

## 2023-01-30 NOTE — ED Provider Notes (Signed)
[facility]    CSN: [id number] Arrival date & time: 01/30/23  1736      History   Chief Complaint Chief Complaint  Patient presents with   Skin Problem    HPI Timothy Patterson is a 27 y.o. male.   Patient presents to urgent care for evaluation of possible infection to tattoo of the right upper extremity near the shoulder.  He states that he got the tattoo on Jan 02, 2023 (28 days ago) and has had intermittent itching to the area since tattoo was placed.  He notes a very small abrasion to the lower part of the tattoo related to intense itching.  He states he has been attempting to avoid itching the area and has been keeping the tattoo moisturized.  He followed the instructions for aftercare for the tattoo given to them by the tattoo shop (clean tattoo with antibacterial soap and water and Aquaphor for 4 to 5 days).  He has stopped using Aquaphor and has been using a moisturizer to the area.  Denies redness, swelling, and pain to the tattoo area.  No recent fevers, chills, or bodyaches.     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 Authorizing Provider  lisdexamfetamine (VYVANSE) 20 MG capsule Take 20 mg by mouth  every morning. 01/28/23  Yes [provider]  AMBULATORY NON FORMULARY MEDICATION Medication Name: NTG ointment 0.125% apply a pea size amount 3 times daily 02/09/19   Sherrilyn Rist, MD  ondansetron (ZOFRAN-ODT) 4 MG disintegrating tablet Take 1 tablet (4 mg total) by mouth every 8 (eight) hours as needed for nausea or vomiting. 12/21/22   Antony Madura, PA-C    Family History Family History  Problem Relation Age of Onset   Depression Mother    Anxiety disorder Mother    Bipolar disorder Mother    Depression Sister    Anxiety disorder Sister    Hyperlipidemia Paternal Grandfather    Bipolar disorder Maternal Grandmother    Depression Maternal Grandmother     Social History Social History   Tobacco Use   Smoking status: Some Days    Types: Cigarettes   Smokeless tobacco: Never   Tobacco comments:    not often  Vaping Use   Vaping Use: Never used  Substance Use Topics   Alcohol use: Yes    Alcohol/week: 0.0 standard drinks of alcohol    Comment: occasional   Drug use: Yes    Frequency: 1.0 times per week    Types: Marijuana     Allergies   Patient has no known allergies.   Review of Systems Review of Systems Per HPI  Physical Exam Triage  Vital Signs ED Triage Vitals  Enc Vitals Group     BP 01/30/23 1826 112/75     Pulse Rate 01/30/23 1826 82     Resp 01/30/23 1826 17     Temp 01/30/23 1826 98.2 F (36.8 C)     Temp Source 01/30/23 1826 Oral     SpO2 01/30/23 1826 98 %     Weight --      Height --      Head Circumference --      Peak Flow --      Pain Score 01/30/23 1822 0     Pain Loc --      Pain Edu? --      Excl. in GC? --    No data found.  Updated Vital Signs BP 112/75 (BP Location: Left Arm)   Pulse 82   Temp 98.2 F (36.8 C) (Oral)   Resp 17   SpO2 98%   Visual Acuity Right Eye Distance:   Left Eye Distance:   Bilateral Distance:    Right Eye Near:   Left Eye Near:    Bilateral Near:     Physical Exam Vitals and  nursing note reviewed.  Constitutional:      Appearance: He is not ill-appearing or toxic-appearing.  HENT:     Head: Normocephalic and atraumatic.     Right Ear: Hearing and external ear normal.     Left Ear: Hearing and external ear normal.     Nose: Nose normal.     Mouth/Throat:     Lips: Pink.  Eyes:     General: Lids are normal. Vision grossly intact. Gaze aligned appropriately.     Extraocular Movements: Extraocular movements intact.     Conjunctiva/sclera: Conjunctivae normal.  Pulmonary:     Effort: Pulmonary effort is normal.  Musculoskeletal:     Cervical back: Neck supple.  Skin:    General: Skin is warm and dry.     Capillary Refill: Capillary refill takes less than 2 seconds.     Findings: No rash.     Comments: Tattoo present to the right upper arm to the deltoid region.  No erythema, soft tissue swelling, or warmth present.  Skin to the distal part of the tattoo appears slightly dry and with a less than 0.5 cm abrasion.  Abrasion is nondraining and superficial, likely related to itching.  Neurological:     General: No focal deficit present.     Mental Status: He is alert and oriented to person, place, and time. Mental status is at baseline.     Cranial Nerves: No dysarthria or facial asymmetry.  Psychiatric:        Mood and Affect: Mood normal.        Speech: Speech normal.        Behavior: Behavior normal.        Thought Content: Thought content normal.        Judgment: Judgment normal.      UC Treatments / Results  Labs (all labs ordered are listed, but only abnormal results are displayed) Labs Reviewed - No data to display  EKG   Radiology No results found.  Procedures Procedures (including critical care time)  Medications Ordered in UC Medications - No data to display  Initial Impression / Assessment and Plan / UC Course  I have reviewed the triage vital signs and the nursing notes.  Pertinent labs & imaging results that were available  during my care of the  patient were reviewed by me and considered in my medical decision making (see chart for details).   1.  Tattoo reaction Tattoo appears to be healing normally and is without infection.  No indication for oral antibiotics today.  Advised to continue using Aquaphor to the tattoo to provide moisture and promote wound healing.  Strict return precautions discussed should he develop any signs or symptoms of infection.  He is agreeable with plan.  May take Zyrtec as needed for itch.   Discussed physical exam and available lab work findings in clinic with patient.  Counseled patient regarding appropriate use of medications and potential side effects for all medications recommended or prescribed today. Discussed red flag signs and symptoms of worsening condition,when to call the PCP office, return to urgent care, and when to seek higher level of care in the emergency department. Patient verbalizes understanding and agreement with plan. All questions answered. Patient discharged in stable condition.    Final Clinical Impressions(s) / UC Diagnoses   Final diagnoses:  Tattoo reaction     Discharge Instructions      Your tattoo does not look infected currently, however please continue using a thin layer of Aquaphor to the wound twice daily for the next several days.  If you start to notice worsening redness, swelling, drainage from the site, or warmth, please return to urgent care for reevaluation.  Otherwise, continue to rinse the site with warm water and soap.   If you develop any new or worsening symptoms or do not improve in the next 2 to 3 days, please return.  If your symptoms are severe, please go to the emergency room.  Follow-up with your primary care provider for further evaluation and management of your symptoms as well as ongoing wellness visits.  I hope you feel better!    ED Prescriptions   None    PDMP not reviewed this encounter.   Carlisle Beers,  [place] 01/30/23 1919

## 2023-01-30 NOTE — Discharge Instructions (Addendum)
Your tattoo does not look infected currently, however please continue using a thin layer of Aquaphor to the wound twice daily for the next several days.  If you start to notice worsening redness, swelling, drainage from the site, or warmth, please return to urgent care for reevaluation.  Otherwise, continue to rinse the site with warm water and soap.   If you develop any new or worsening symptoms or do not improve in the next 2 to 3 days, please return.  If your symptoms are severe, please go to the emergency room.  Follow-up with your primary care provider for further evaluation and management of your symptoms as well as ongoing wellness visits.  I hope you feel better!

## 2023-01-30 NOTE — ED Triage Notes (Signed)
Pt got new tattoo May 2 on RUE. Reports about a week later having "body acne" bumps, "cysts or someone else said could be a keloid".  "I bit my tongue and not healing as fast as normally would so feel my body must have an infection" Been washing area with an antibacterial soap.  Pt reports that tattoo artist told patient may be allergy to the ink and sent him a picture of the ingredients.

## 2023-02-24 ENCOUNTER — Ambulatory Visit: Payer: BC Managed Care – PPO | Admitting: Internal Medicine

## 2023-04-03 ENCOUNTER — Emergency Department (HOSPITAL_BASED_OUTPATIENT_CLINIC_OR_DEPARTMENT_OTHER)
Admission: EM | Admit: 2023-04-03 | Discharge: 2023-04-03 | Disposition: A | Discharge status: Home / Self Care | Payer: BC Managed Care – PPO | Attending: Emergency Medicine | Admitting: Emergency Medicine

## 2023-04-03 ENCOUNTER — Other Ambulatory Visit: Payer: Self-pay

## 2023-04-03 ENCOUNTER — Encounter (HOSPITAL_BASED_OUTPATIENT_CLINIC_OR_DEPARTMENT_OTHER): Payer: Self-pay | Admitting: Emergency Medicine

## 2023-04-03 DIAGNOSIS — H60502 Unspecified acute noninfective otitis externa, left ear: Secondary | ICD-10-CM | POA: Diagnosis not present

## 2023-04-03 DIAGNOSIS — H9202 Otalgia, left ear: Secondary | ICD-10-CM | POA: Diagnosis present

## 2023-04-03 MED ORDER — CIPROFLOXACIN-DEXAMETHASONE 0.3-0.1 % OT SUSP: 4.0000 [drp] | Freq: Two times a day (BID) | OTIC | 0 refills | Status: AC

## 2023-04-03 NOTE — ED Triage Notes (Signed)
Patient c/o left ear pain.  Patient seen recently for same and given abx, has taken them appropriately.  Patient reports he has had drainage from his ear.  Patient reports muffled hearing.

## 2023-04-03 NOTE — ED Notes (Signed)
Reviewed AVS/discharge instruction with patient. Time allotted for and all questions answered. Patient is agreeable for d/c and escorted to ed exit by staff.  

## 2023-04-03 NOTE — Discharge Instructions (Addendum)
It was a pleasure taking care of you today!   You will be sent a prescription for ciprodex drops, take as prescribed. Attached is information for the on-call ENT specialist, call and set up a follow up appointment regarding todays ED visit. Attached is information for [facility] to establish care with a primary care provider. Return to the ED if you are experiencing increasing/worsening symptoms.

## 2023-04-03 NOTE — ED Provider Notes (Signed)
[place] EMERGENCY DEPARTMENT AT [facility] Provider Note   CSN: [id number] Arrival date & time: 04/03/23  1337     History  Chief Complaint  Patient presents with   Otalgia    Timothy Patterson is a 27 y.o. male who presents to the emergency department with concerns for left ear pain onset 1 week.  Notes that he was seen previously for similar symptoms and treated with Augmentin and has completed the antibiotics.  Notes that he has had drainage from the ear that has been yellowish in appearance.  Also notes muffled hearing.  Does not have a primary care provider.  Was not given information for ENT specialist for follow-up.  Has also tried Tylenol at home for her symptoms.  Denies fever, chills.  Notes that he went swimming prior to the onset of his symptoms.   Per patient chart review: Patient was evaluated in urgent care on 03/28/2023 for similar symptoms.  At that time he was started on Augmentin.  The history is provided by the patient. No language interpreter was used.       Home Medications Prior to Admission medications   Medication Sig Start Date End Date Taking? Authorizing Provider  amoxicillin-clavulanate (AUGMENTIN) 875-125 MG tablet Take 1 tablet by mouth 2 (two) times daily. 03/28/23 04/04/23 Yes [provider]  ciprofloxacin-dexamethasone (CIPRODEX) OTIC suspension Place 4 drops into the left ear 2 (two) times daily for 7 days. 04/03/23 04/10/23 Yes Arly Salminen A, PA-C  traZODone (DESYREL) 50 MG tablet Take 50 mg by mouth at bedtime. 04/03/23  Yes [provider]  AMBULATORY NON FORMULARY MEDICATION Medication Name: NTG ointment 0.125% apply a pea size amount 3 times daily 02/09/19   Sherrilyn Rist, MD  lisdexamfetamine (VYVANSE) 20 MG capsule Take 20 mg by mouth every morning. 01/28/23   [provider]  ondansetron (ZOFRAN-ODT) 4 MG disintegrating tablet Take 1 tablet (4 mg total) by mouth every 8 (eight) hours as needed for nausea  or vomiting. 12/21/22   Antony Madura, PA-C      Allergies    Patient has no known allergies.    Review of Systems   Review of Systems  HENT:  Positive for ear pain.   All other systems reviewed and are negative.   Physical Exam Updated Vital Signs BP (!) 141/79   Pulse 82   Temp 97.6 F (36.4 C) (Oral)   Resp 18   Ht 5\' 6"  (1.676 m)   Wt 96 kg   SpO2 99%   BMI 34.16 kg/m  Physical Exam Vitals and nursing note reviewed.  Constitutional:      General: He is not in acute distress.    Appearance: Normal appearance.  HENT:     Right Ear: Tympanic membrane, ear canal and external ear normal. No mastoid tenderness.     Left Ear: Drainage, swelling and tenderness present. No foreign body. No mastoid tenderness.     Ears:     Comments: Unable to visualize full left TM, only able to see inferior aspect of left TM. Edema, TTP, and drainage noted to left canal. No mastoid TTP noted.  Eyes:     General: No scleral icterus.    Extraocular Movements: Extraocular movements intact.  Cardiovascular:     Rate and Rhythm: Normal rate.  Pulmonary:     Effort: Pulmonary effort is normal. No respiratory distress.  Abdominal:     Palpations: Abdomen is soft. There is no mass.  Tenderness: There is no abdominal tenderness.  Musculoskeletal:        General: Normal range of motion.     Cervical back: Neck supple.  Skin:    General: Skin is warm and dry.     Findings: No rash.  Neurological:     Mental Status: He is alert.     Sensory: Sensation is intact.     Motor: Motor function is intact.  Psychiatric:        Behavior: Behavior normal.     ED Results / Procedures / Treatments   Labs (all labs ordered are listed, but only abnormal results are displayed) Labs Reviewed - No data to display  EKG None  Radiology No results found.  Procedures Procedures    Medications Ordered in ED Medications - No data to display  ED Course/ Medical Decision Making/ A&P                                  Medical Decision Making Risk Prescription drug management.   Patient presents with left ear pain onset 1 week. Denies sick contacts.  Vital signs pt afebrile.  On exam patient with Unable to visualize full left TM, only able to see inferior aspect of left TM. Edema, TTP, and drainage noted to left canal. No mastoid TTP noted. Differential diagnosis includes otitis media, otitis externa, acute mastoiditis, meningitis.    Additional history obtained:  External records from outside source obtained and reviewed including: Patient was evaluated in urgent care on 03/28/2023 for similar symptoms.  At that time he was started on Augmentin.   Disposition: Patient presentation suspicious for otitis externa. Doubt otitis media, acute mastoiditis, meningitis at this time. After consideration of the diagnostic results and the patients response to treatment, I feel that the patient would benefit from Discharge home. Pt to be discharged home with Ciprodex Rx. Also provided with information for ENT specialist for follow up regarding todays ED visit. Work note provided. Supportive care measures and strict return precautions discussed with patient at bedside. Pt acknowledges and verbalizes understanding. Pt appears safe for discharge. Follow up as indicated in discharge paperwork.    This chart was dictated using voice recognition software, Dragon. Despite the best efforts of this provider to proofread and correct errors, errors may still occur which can change documentation meaning.   Final Clinical Impression(s) / ED Diagnoses Final diagnoses:  Acute otitis externa of left ear, unspecified type    Rx / DC Orders ED Discharge Orders          Ordered    ciprofloxacin-dexamethasone (CIPRODEX) OTIC suspension  2 times daily        04/03/23 1511              Shriley Joffe A, PA-C 04/03/23 1532    Terrilee Files, MD 04/03/23 1842

## 2023-04-08 ENCOUNTER — Telehealth: Payer: Self-pay | Admitting: Otolaryngology

## 2023-04-08 NOTE — Telephone Encounter (Signed)
I tried to call patient based on ED referral we received for ENT from [facility] for Acute otitis externa of left ear, unspecified type. I was unable to lvm
